# Patient Record
Sex: Female | Born: 1965 | Race: Black or African American | Hispanic: No | Marital: Single | Smoking: Current every day smoker
Health system: Southern US, Community
[De-identification: ages and names within clinical notes are randomized; demographics above are authoritative.]

## PROBLEM LIST (undated history)

## (undated) DIAGNOSIS — M1990 Unspecified osteoarthritis, unspecified site: Secondary | ICD-10-CM

## (undated) DIAGNOSIS — R079 Chest pain, unspecified: Secondary | ICD-10-CM

## (undated) DIAGNOSIS — E119 Type 2 diabetes mellitus without complications: Secondary | ICD-10-CM

## (undated) HISTORY — PX: KNEE SURGERY: SHX244

## (undated) HISTORY — PX: TUBAL LIGATION: SHX77

## (undated) HISTORY — DX: Chest pain, unspecified: R07.9

## (undated) HISTORY — DX: Unspecified osteoarthritis, unspecified site: M19.90

---

## 1989-08-24 HISTORY — PX: TUBAL LIGATION: SHX77

## 1998-07-15 ENCOUNTER — Emergency Department (HOSPITAL_COMMUNITY): Admission: EM | Admit: 1998-07-15 | Discharge: 1998-07-15 | Payer: Self-pay | Admitting: Emergency Medicine

## 1998-08-06 ENCOUNTER — Ambulatory Visit (HOSPITAL_COMMUNITY): Admission: RE | Admit: 1998-08-06 | Discharge: 1998-08-06 | Payer: Self-pay | Admitting: Family Medicine

## 1998-08-06 ENCOUNTER — Emergency Department (HOSPITAL_COMMUNITY): Admission: EM | Admit: 1998-08-06 | Discharge: 1998-08-06 | Payer: Self-pay | Admitting: Emergency Medicine

## 1998-08-06 ENCOUNTER — Encounter: Payer: Self-pay | Admitting: Family Medicine

## 1999-11-22 ENCOUNTER — Emergency Department (HOSPITAL_COMMUNITY): Admission: EM | Admit: 1999-11-22 | Discharge: 1999-11-22 | Payer: Self-pay | Admitting: Emergency Medicine

## 2001-01-17 ENCOUNTER — Encounter: Payer: Self-pay | Admitting: Emergency Medicine

## 2001-01-17 ENCOUNTER — Emergency Department (HOSPITAL_COMMUNITY): Admission: EM | Admit: 2001-01-17 | Discharge: 2001-01-17 | Payer: Self-pay | Admitting: Emergency Medicine

## 2001-12-19 ENCOUNTER — Emergency Department (HOSPITAL_COMMUNITY): Admission: EM | Admit: 2001-12-19 | Discharge: 2001-12-19 | Payer: Self-pay | Admitting: Emergency Medicine

## 2003-05-14 ENCOUNTER — Emergency Department (HOSPITAL_COMMUNITY): Admission: EM | Admit: 2003-05-14 | Discharge: 2003-05-14 | Payer: Self-pay | Admitting: Emergency Medicine

## 2003-05-17 ENCOUNTER — Encounter: Payer: Self-pay | Admitting: Emergency Medicine

## 2003-05-17 ENCOUNTER — Emergency Department (HOSPITAL_COMMUNITY): Admission: EM | Admit: 2003-05-17 | Discharge: 2003-05-17 | Payer: Self-pay | Admitting: Emergency Medicine

## 2004-03-29 ENCOUNTER — Emergency Department (HOSPITAL_COMMUNITY): Admission: EM | Admit: 2004-03-29 | Discharge: 2004-03-29 | Payer: Self-pay | Admitting: Emergency Medicine

## 2004-12-28 ENCOUNTER — Emergency Department (HOSPITAL_COMMUNITY): Admission: EM | Admit: 2004-12-28 | Discharge: 2004-12-28 | Payer: Self-pay | Admitting: Emergency Medicine

## 2004-12-30 ENCOUNTER — Emergency Department (HOSPITAL_COMMUNITY): Admission: EM | Admit: 2004-12-30 | Discharge: 2004-12-30 | Payer: Self-pay | Admitting: Emergency Medicine

## 2006-04-03 ENCOUNTER — Emergency Department (HOSPITAL_COMMUNITY): Admission: EM | Admit: 2006-04-03 | Discharge: 2006-04-03 | Payer: Self-pay | Admitting: Family Medicine

## 2007-05-10 ENCOUNTER — Emergency Department (HOSPITAL_COMMUNITY): Admission: EM | Admit: 2007-05-10 | Discharge: 2007-05-10 | Payer: Self-pay | Admitting: Emergency Medicine

## 2009-09-25 ENCOUNTER — Emergency Department (HOSPITAL_COMMUNITY): Admission: EM | Admit: 2009-09-25 | Discharge: 2009-09-25 | Payer: Self-pay | Admitting: Emergency Medicine

## 2010-05-14 ENCOUNTER — Emergency Department (HOSPITAL_COMMUNITY): Admission: EM | Admit: 2010-05-14 | Discharge: 2010-05-14 | Payer: Self-pay | Admitting: Emergency Medicine

## 2010-05-14 ENCOUNTER — Emergency Department (HOSPITAL_COMMUNITY): Admission: EM | Admit: 2010-05-14 | Discharge: 2010-05-14 | Payer: Self-pay | Admitting: Family Medicine

## 2010-11-06 LAB — URINALYSIS, ROUTINE W REFLEX MICROSCOPIC
Glucose, UA: NEGATIVE mg/dL
Ketones, ur: 15 mg/dL — AB
Nitrite: NEGATIVE
pH: 6.5 (ref 5.0–8.0)

## 2010-11-06 LAB — CBC
Hemoglobin: 14.1 g/dL (ref 12.0–15.0)
MCH: 33.5 pg (ref 26.0–34.0)
MCHC: 33.9 g/dL (ref 30.0–36.0)
Platelets: 190 10*3/uL (ref 150–400)
RBC: 4.21 MIL/uL (ref 3.87–5.11)

## 2010-11-06 LAB — POCT I-STAT, CHEM 8
BUN: 11 mg/dL (ref 6–23)
Chloride: 105 mEq/L (ref 96–112)
HCT: 43 % (ref 36.0–46.0)
Potassium: 4 mEq/L (ref 3.5–5.1)
Sodium: 138 mEq/L (ref 135–145)

## 2010-11-06 LAB — URINE MICROSCOPIC-ADD ON

## 2010-11-06 LAB — URINE CULTURE: Colony Count: >=100000

## 2010-11-06 LAB — POCT PREGNANCY, URINE: Preg Test, Ur: NEGATIVE

## 2011-03-18 ENCOUNTER — Emergency Department (HOSPITAL_COMMUNITY): Payer: Self-pay

## 2011-03-18 ENCOUNTER — Emergency Department (HOSPITAL_COMMUNITY)
Admission: EM | Admit: 2011-03-18 | Discharge: 2011-03-18 | Disposition: A | Discharge status: Home / Self Care | Payer: Self-pay | Attending: Emergency Medicine | Admitting: Emergency Medicine

## 2011-03-18 DIAGNOSIS — R55 Syncope and collapse: Secondary | ICD-10-CM | POA: Insufficient documentation

## 2011-03-18 DIAGNOSIS — R51 Headache: Secondary | ICD-10-CM | POA: Insufficient documentation

## 2011-03-18 DIAGNOSIS — R5383 Other fatigue: Secondary | ICD-10-CM | POA: Insufficient documentation

## 2011-03-18 DIAGNOSIS — R42 Dizziness and giddiness: Secondary | ICD-10-CM | POA: Insufficient documentation

## 2011-03-18 DIAGNOSIS — R5381 Other malaise: Secondary | ICD-10-CM | POA: Insufficient documentation

## 2011-03-18 LAB — URINALYSIS, ROUTINE W REFLEX MICROSCOPIC
Ketones, ur: NEGATIVE mg/dL
Nitrite: NEGATIVE
Protein, ur: NEGATIVE mg/dL
Urobilinogen, UA: 1 mg/dL (ref 0.0–1.0)

## 2011-03-18 LAB — CBC
HCT: 41 % (ref 36.0–46.0)
Hemoglobin: 14.2 g/dL (ref 12.0–15.0)
MCH: 33.6 pg (ref 26.0–34.0)
MCHC: 34.6 g/dL (ref 30.0–36.0)
MCV: 96.9 fL (ref 78.0–100.0)

## 2011-03-18 LAB — DIFFERENTIAL
Basophils Absolute: 0 10*3/uL (ref 0.0–0.1)
Eosinophils Relative: 1 % (ref 0–5)
Lymphocytes Relative: 30 % (ref 12–46)
Lymphs Abs: 2.3 10*3/uL (ref 0.7–4.0)
Monocytes Absolute: 0.8 10*3/uL (ref 0.1–1.0)
Monocytes Relative: 10 % (ref 3–12)
Neutro Abs: 4.6 10*3/uL (ref 1.7–7.7)

## 2011-03-18 LAB — POCT I-STAT, CHEM 8
Calcium, Ion: 1.17 mmol/L (ref 1.12–1.32)
Chloride: 102 mEq/L (ref 96–112)
Glucose, Bld: 92 mg/dL (ref 70–99)
HCT: 44 % (ref 36.0–46.0)
Hemoglobin: 15 g/dL (ref 12.0–15.0)

## 2011-03-18 LAB — POCT PREGNANCY, URINE: Preg Test, Ur: NEGATIVE

## 2011-06-04 LAB — I-STAT 8, (EC8 V) (CONVERTED LAB)
BUN: 9
Bicarbonate: 23.3
Glucose, Bld: 92
HCT: 46
Operator id: 285491
TCO2: 24
pCO2, Ven: 37.5 — ABNORMAL LOW

## 2011-06-04 LAB — D-DIMER, QUANTITATIVE: D-Dimer, Quant: 0.29

## 2011-06-04 LAB — POCT CARDIAC MARKERS
CKMB, poc: <1 — ABNORMAL LOW
Troponin i, poc: <0.05

## 2011-06-04 LAB — POCT I-STAT CREATININE: Operator id: 285491

## 2011-12-09 ENCOUNTER — Encounter (HOSPITAL_COMMUNITY): Payer: Self-pay | Admitting: *Deleted

## 2011-12-09 ENCOUNTER — Emergency Department (HOSPITAL_COMMUNITY)
Admission: EM | Admit: 2011-12-09 | Discharge: 2011-12-09 | Disposition: A | Discharge status: Home / Self Care | Payer: Self-pay | Attending: Emergency Medicine | Admitting: Emergency Medicine

## 2011-12-09 DIAGNOSIS — L03317 Cellulitis of buttock: Secondary | ICD-10-CM | POA: Insufficient documentation

## 2011-12-09 DIAGNOSIS — L0231 Cutaneous abscess of buttock: Secondary | ICD-10-CM | POA: Insufficient documentation

## 2011-12-09 MED ORDER — CEPHALEXIN 500 MG PO CAPS: 500.0000 mg | ORAL_CAPSULE | Freq: Four times a day (QID) | ORAL | Status: AC

## 2011-12-09 MED ORDER — HYDROCODONE-ACETAMINOPHEN 5-500 MG PO TABS: 1.0000 | ORAL_TABLET | Freq: Four times a day (QID) | ORAL | Status: AC | PRN

## 2011-12-09 MED ORDER — SULFAMETHOXAZOLE-TRIMETHOPRIM 800-160 MG PO TABS: 1.0000 | ORAL_TABLET | Freq: Two times a day (BID) | ORAL | Status: AC

## 2011-12-09 MED ORDER — LIDOCAINE-EPINEPHRINE (PF) 2 %-1:200000 IJ SOLN
10.0000 mL | Freq: Once | INTRAMUSCULAR | Status: AC
  Administered 2011-12-09: 10 mL

## 2011-12-09 NOTE — Discharge Instructions (Signed)
Continue keeping that area clean, warm compresses several times a day. Try to keep packing in. Take both keflex and bactrim for the infection, take vicodin as prescribed as needed for severe pain, do not drive while taking.   Abscess An abscess (boil or furuncle) is an infected area that contains a collection of pus.  SYMPTOMS Signs and symptoms of an abscess include pain, tenderness, redness, or hardness. You may feel a moveable soft area under your skin. An abscess can occur anywhere in the body.  TREATMENT  A surgical cut (incision) may be made over your abscess to drain the pus. Gauze may be packed into the space or a drain may be looped through the abscess cavity (pocket). This provides a drain that will allow the cavity to heal from the inside outwards. The abscess may be painful for a few days, but should feel much better if it was drained.  Your abscess, if seen early, may not have localized and may not have been drained. If not, another appointment may be required if it does not get better on its own or with medications. HOME CARE INSTRUCTIONS   Only take over-the-counter or prescription medicines for pain, discomfort, or fever as directed by your caregiver.   Take your antibiotics as directed if they were prescribed. Finish them even if you start to feel better.   Keep the skin and clothes clean around your abscess.   If the abscess was drained, you will need to use gauze dressing to collect any draining pus. Dressings will typically need to be changed 3 or more times a day.   The infection may spread by skin contact with others. Avoid skin contact as much as possible.   Practice good hygiene. This includes regular hand washing, cover any draining skin lesions, and do not share personal care items.   If you participate in sports, do not share athletic equipment, towels, whirlpools, or personal care items. Shower after every practice or tournament.   If a draining area cannot be  adequately covered:   Do not participate in sports.   Children should not participate in day care until the wound has healed or drainage stops.   If your caregiver has given you a follow-up appointment, it is very important to keep that appointment. Not keeping the appointment could result in a much worse infection, chronic or permanent injury, pain, and disability. If there is any problem keeping the appointment, you must call back to this facility for assistance.  SEEK MEDICAL CARE IF:   You develop increased pain, swelling, redness, drainage, or bleeding in the wound site.   You develop signs of generalized infection including muscle aches, chills, fever, or a general ill feeling.   You have an oral temperature above 102 F (38.9 C).  MAKE SURE YOU:   Understand these instructions.   Will watch your condition.   Will get help right away if you are not doing well or get worse.  Document Released: 05/20/2005 Document Revised: 07/30/2011 Document Reviewed: 03/13/2008 [facility] Patient Information 2012 [place], [place].

## 2011-12-09 NOTE — ED Provider Notes (Signed)
History     CSN: [id number]  Arrival date & time 12/09/11  1127   First MD Initiated Contact with Patient 12/09/11 1129      No chief complaint on file.   (Consider location/radiation/quality/duration/timing/severity/associated sxs/prior treatment) Patient is a 46 y.o. female presenting with abscess. The history is provided by the patient.  Abscess  This is a new problem. The current episode started less than one week ago. The problem has been gradually worsening. The abscess is present on the right buttock. The problem is moderate. The abscess is characterized by draining, swelling, painfulness and redness. The patient was exposed to OTC medications. Pertinent negatives include no fever.  PT reports abscess that has been worsening over last week. States she has been soaking it, applying warm compresses, with no relief. States hx of the same, but usually resolve in their own. No fever, chills, malaise.   No past medical history on file.  No past surgical history on file.  No family history on file.  History  Substance Use Topics  . Smoking status: Not on file  . Smokeless tobacco: Not on file  . Alcohol Use: Not on file    OB History    No data available      Review of Systems  Constitutional: Negative for fever and chills.  Respiratory: Negative.   Cardiovascular: Negative.   Gastrointestinal: Negative.   Genitourinary: Negative.   Musculoskeletal: Negative.   Skin:       Positive for abscess  Neurological: Negative.   Psychiatric/Behavioral: Negative.     Allergies  Review of patient's allergies indicates no known allergies.  Home Medications   Current Outpatient Rx  Name Route Sig Dispense Refill  . IBUPROFEN 200 MG PO TABS Oral Take 200 mg by mouth every 6 (six) hours as needed. For pain      There were no vitals taken for this visit.  Physical Exam  Constitutional: She is oriented to person, place, and time. She appears well-developed and  well-nourished. No distress.  HENT:  Head: Normocephalic.  Eyes: Conjunctivae are normal.  Neck: Neck supple.  Cardiovascular: Normal rate, regular rhythm and normal heart sounds.   Pulmonary/Chest: Effort normal and breath sounds normal. No respiratory distress.  Neurological: She is alert and oriented to person, place, and time.  Skin: Skin is warm and dry.       Large, about 5cm abscess to the right mid buttock. Erythemous, indurated, tender to palpation. No drainage  Psychiatric: She has a normal mood and affect.    ED Course  Procedures (including critical care time)  INCISION AND DRAINAGE Performed by: Jaynie Crumble A Consent: Verbal consent obtained. Risks and benefits: risks, benefits and alternatives were discussed Type: abscess  Body area: Right buttocks  Anesthesia: local infiltration  Local anesthetic: lidocaine 2% w/ epinephrine  Anesthetic total: 5 ml  Complexity: complex Blunt dissection to break up loculations  Drainage: purulent  Drainage amount:large  Packing material: 1/4 in iodoform gauze  Patient tolerance: Patient tolerated the procedure well with no immediate complications.  12:40 PM Abscess drained, packed. Pt tolerated it well. She is otherwise non toxic. Will start on antibiotics and close follow up.    1. Abscess of buttock, right       MDM          Lottie Mussel, PA 12/09/11 1554

## 2011-12-09 NOTE — ED Notes (Signed)
Pt reports an abscess in her butt crack area since Saturday-started to have mild drainage today.

## 2011-12-10 NOTE — ED Provider Notes (Signed)
Medical screening examination/treatment/procedure(s) were performed by non-physician practitioner and as supervising physician I was immediately available for consultation/collaboration.  Yanitza Shvartsman, MD 12/10/11 0729 

## 2011-12-11 ENCOUNTER — Encounter (HOSPITAL_COMMUNITY): Payer: Self-pay | Admitting: Emergency Medicine

## 2011-12-11 ENCOUNTER — Emergency Department (HOSPITAL_COMMUNITY)
Admission: EM | Admit: 2011-12-11 | Discharge: 2011-12-11 | Disposition: A | Discharge status: Home / Self Care | Payer: Self-pay | Attending: Emergency Medicine | Admitting: Emergency Medicine

## 2011-12-11 DIAGNOSIS — F17200 Nicotine dependence, unspecified, uncomplicated: Secondary | ICD-10-CM | POA: Insufficient documentation

## 2011-12-11 DIAGNOSIS — L02219 Cutaneous abscess of trunk, unspecified: Secondary | ICD-10-CM | POA: Insufficient documentation

## 2011-12-11 DIAGNOSIS — L0291 Cutaneous abscess, unspecified: Secondary | ICD-10-CM

## 2011-12-11 DIAGNOSIS — Z4801 Encounter for change or removal of surgical wound dressing: Secondary | ICD-10-CM | POA: Insufficient documentation

## 2011-12-11 NOTE — ED Provider Notes (Signed)
History     CSN: [id number]  Arrival date & time 12/11/11  4098   First MD Initiated Contact with Patient 12/11/11 [phone]      Chief Complaint  Patient presents with  . Abscess    wound check    (Consider location/radiation/quality/duration/timing/severity/associated sxs/prior treatment) HPI Comments: 46 yo female returns to the ED this morning for a wound check.  An I&D of an abscess on the right side of the gluteal cleft was performed on 12/09/2011 and the wound was packed until this morning when the packing fell out. Patient was started on antibiotics at that time. Abscess has not been painful, but it continues to ooze pus. Denies fever, malaise.   Patient is a 46 y.o. female presenting with abscess. The history is provided by the patient.  Abscess  This is a new problem. The current episode started more than one week ago. The onset was gradual. The problem has been rapidly improving. Pertinent negatives include no fever and no vomiting.    History reviewed. No pertinent past medical history.  Past Surgical History  Procedure Date  . Tubal ligation     History reviewed. No pertinent family history.  History  Substance Use Topics  . Smoking status: Current Everyday Smoker    Types: Cigarettes  . Smokeless tobacco: Not on file  . Alcohol Use: No    OB History    Grav Para Term Preterm Abortions TAB SAB Ect Mult Living                  Review of Systems  Constitutional: Negative for fever.  Gastrointestinal: Negative for nausea, vomiting and rectal pain.  Skin: Negative for color change.       Positive for abscess.  Hematological: Negative for adenopathy.    Allergies  Review of patient's allergies indicates no known allergies.  Home Medications   Current Outpatient Rx  Name Route Sig Dispense Refill  . CEPHALEXIN 500 MG PO CAPS Oral Take 1 capsule (500 mg total) by mouth 4 (four) times daily. 28 capsule 0  . IBUPROFEN 200 MG PO TABS Oral Take 200 mg by  mouth every 6 (six) hours as needed. For pain    . SULFAMETHOXAZOLE-TRIMETHOPRIM 800-160 MG PO TABS Oral Take 1 tablet by mouth every 12 (twelve) hours. 14 tablet 0  . HYDROCODONE-ACETAMINOPHEN 5-500 MG PO TABS Oral Take 1-2 tablets by mouth every 6 (six) hours as needed for pain. 10 tablet 0    BP 109/68  Pulse 86  Temp(Src) 98.4 F (36.9 C) (Oral)  SpO2 100%  LMP 11/02/2011  Physical Exam  Nursing note and vitals reviewed. Constitutional: She is oriented to person, place, and time. She appears well-developed and well-nourished. No distress.  HENT:  Head: Normocephalic and atraumatic.  Eyes: Conjunctivae are normal.  Neck: Normal range of motion. Neck supple.  Pulmonary/Chest: No respiratory distress.  Neurological: She is alert and oriented to person, place, and time.  Skin: Skin is warm and dry.       Open abscess with incision approximately 1cm in diameter, slightly gaping, with mild surrounding erythema. There is small amount of purulence noted but no active drainage. No signs of cellulitis in surrounding skin.   Psychiatric: She has a normal mood and affect.    ED Course  Procedures (including critical care time)  Labs Reviewed - No data to display No results found.   1. Abscess     10:39 AM Patient seen and examined.  Vital signs  reviewed and are as follows: Filed Vitals:   12/11/11 0939  BP: 109/68  Pulse: 86  Temp: 98.4 F (36.9 C)   Wound is healing well. It does not need repacked. Patient counseled to continue warm soaks.   The patient was urged to return to the Emergency Department urgently with worsening pain, swelling, expanding erythema especially if it streaks away from the affected area, fever, or if they have any other concerns. Patient verbalized understanding.     MDM  Abscess, healing well. Pt to continue oral abx, return with worsening.         Renne Crigler, [place] 12/11/11 [phone]

## 2011-12-11 NOTE — ED Provider Notes (Signed)
Medical screening examination/treatment/procedure(s) were performed by non-physician practitioner and as supervising physician I was immediately available for consultation/collaboration.    Luvina Poirier R Brittanyann Wittner, MD 12/11/11 1626 

## 2011-12-11 NOTE — Discharge Instructions (Signed)
Please read and follow all provided instructions.  Your diagnoses today include:  1. Abscess     Tests performed today include:  Vital signs. See below for your results today.   Medications prescribed: None today  Home care instructions:   Follow any educational materials contained in this packet  Take full course of previously prescribed antibiotics  Follow-up instructions: Please follow-up with your primary care provider in the next 1 week for further evaluation of your symptoms. If you do not have a primary care doctor -- see below for referral information.   Return instructions:  Return to the Emergency Department if you have:  Fever  Worsening symptoms  Worsening pain  Worsening swelling  Redness of the skin that moves away from the affected area, especially if it streaks away from the affected area   Any other emergent concerns  Additional Information: If you have recurrent abscesses, try both the following. Use a Qtip to apply an over-the-counter antibiotic to the inside of your nostrils, twice a day for 5 days. Wash your body with over-the-counter Hibaclens once a day for one week and then once every two weeks. This can reduce the amount of bacterial on your skin that causes boils and lead to fewer boils. If you continue to have multiple or recurrent boils, you should see a dermatologist (skin doctor).   Your vital signs today were: BP 109/68  Pulse 86  Temp(Src) 98.4 F (36.9 C) (Oral)  SpO2 100%  LMP 11/02/2011 If your blood pressure (BP) was elevated above 135/85 this visit, please have this repeated by your doctor within one month. -------------- No Primary Care Doctor Call [facility]  [phone] Other agencies that provide inexpensive medical care    [facility]  [phone]    [facility]  [phone]    [facility]  [phone]    [facility]  [phone]    [facility]  [phone]    [facility]   [phone] -------------- RESOURCE GUIDE:  Dental Problems  Patients with Medicaid: [facility] [id number]                                            [phone] [address] Phone:  [phone]                                                   Phone:  [phone]  If unable to pay or uninsured, contact:  [facility] or [facility]. to become qualified for the adult dental clinic.  Chronic Pain Problems Contact [facility]  [phone] Patients need to be referred by their primary care doctor.  Insufficient Money for Medicine Contact [employer]:  call "211" or [facility] [phone].  Psychological Services [facility]  [phone] [facility]  [phone] [facility]   [phone] (emergency services [phone])  Substance Abuse Resources [facility]  [phone] [facility] [phone] [facility] [phone] Floydene Flock [phone] Residential & Outpatient Substance Abuse Program  [phone]  Abuse/Neglect [facility] Child Abuse Hotline [phone] [facility] Child Abuse Hotline 667-423-7581 (After Hours)  Emergency Shelter [facility] [phone]  M[facility] [phone] [facility] [phone]  [facility]  [facility]     [employer]                          [facility] Dept. [address] [place]                       [address]      [address]  Blondell Reveal Phone:  [phone]                                   Phone:  [phone]                 Phone:  [phone]  [facility] Phone:  [phone]  [facility] Child Abuse Hotline [phone] [phone] (After Hours)

## 2013-04-10 ENCOUNTER — Encounter (HOSPITAL_COMMUNITY): Payer: Self-pay | Admitting: Emergency Medicine

## 2013-04-10 ENCOUNTER — Emergency Department (HOSPITAL_COMMUNITY)
Admission: EM | Admit: 2013-04-10 | Discharge: 2013-04-10 | Disposition: A | Discharge status: Home / Self Care | Payer: Self-pay | Attending: Emergency Medicine | Admitting: Emergency Medicine

## 2013-04-10 DIAGNOSIS — M549 Dorsalgia, unspecified: Secondary | ICD-10-CM | POA: Insufficient documentation

## 2013-04-10 DIAGNOSIS — R350 Frequency of micturition: Secondary | ICD-10-CM | POA: Insufficient documentation

## 2013-04-10 DIAGNOSIS — F17200 Nicotine dependence, unspecified, uncomplicated: Secondary | ICD-10-CM | POA: Insufficient documentation

## 2013-04-10 LAB — URINALYSIS, ROUTINE W REFLEX MICROSCOPIC
Glucose, UA: NEGATIVE mg/dL
Specific Gravity, Urine: 1.03 (ref 1.005–1.030)

## 2013-04-10 LAB — URINE MICROSCOPIC-ADD ON

## 2013-04-10 MED ORDER — HYDROCODONE-ACETAMINOPHEN 5-325 MG PO TABS
2.0000 | ORAL_TABLET | Freq: Once | ORAL | Status: AC
  Administered 2013-04-10: 2 via ORAL
  Filled 2013-04-10: qty 2

## 2013-04-10 MED ORDER — HYDROCODONE-ACETAMINOPHEN 5-325 MG PO TABS: 2.0000 | ORAL_TABLET | Freq: Four times a day (QID) | ORAL | Status: DC | PRN

## 2013-04-10 MED ORDER — PROMETHAZINE HCL 25 MG PO TABS: 25.0000 mg | ORAL_TABLET | Freq: Four times a day (QID) | ORAL | Status: DC | PRN

## 2013-04-10 MED ORDER — DIAZEPAM 5 MG PO TABS: 5.0000 mg | ORAL_TABLET | Freq: Two times a day (BID) | ORAL | Status: DC

## 2013-04-10 MED ORDER — ONDANSETRON 8 MG PO TBDP
8.0000 mg | ORAL_TABLET | Freq: Once | ORAL | Status: AC
  Administered 2013-04-10: 8 mg via ORAL
  Filled 2013-04-10: qty 1

## 2013-04-10 MED ORDER — DIAZEPAM 5 MG PO TABS
5.0000 mg | ORAL_TABLET | Freq: Once | ORAL | Status: AC
  Administered 2013-04-10: 5 mg via ORAL
  Filled 2013-04-10: qty 1

## 2013-04-10 NOTE — ED Provider Notes (Signed)
CSN: [id number]     Arrival date & time 04/10/13  1337 History  This chart was scribed for non-physician practitioner, Junious Silk, PA-C working with Celene Kras, MD by Greggory Stallion, ED scribe. This patient was seen in room WTR9/WTR9 and the patient's care was started at 2:27 PM.   Chief Complaint  Patient presents with  . Back Pain   The history is provided by the patient. No language interpreter was used.    HPI Comments: Rachael Robinson is a 47 y.o. female who presents to the Emergency Department complaining of gradual onset, gradually worsening right lower and mid back pain that started 2 weeks ago. She states the pain started after she was trying to get out of the bathtub and pulled something in her back. Certain movements worsen the pain. Pt has tried ibuprofen and a heating pad with no relief. She states the ibuprofen used to work but it doesn't anymore. Pt states she has been using the bathroom more frequently. Pt denies fever, nausea, emesis, rash, dysuria, urinary urgency, bowel incontinence and urinary incontinence as associated symptoms.    History reviewed. No pertinent past medical history. Past Surgical History  Procedure Laterality Date  . Tubal ligation     No family history on file. History  Substance Use Topics  . Smoking status: Current Every Day Smoker    Types: Cigarettes  . Smokeless tobacco: Not on file  . Alcohol Use: No   OB History   Grav Para Term Preterm Abortions TAB SAB Ect Mult Living                 Review of Systems  Constitutional: Negative for fever.  Gastrointestinal: Negative for nausea and vomiting.  Genitourinary: Positive for frequency. Negative for dysuria and urgency.  Musculoskeletal: Positive for back pain.  Skin: Negative for rash.  All other systems reviewed and are negative.    Allergies  Review of patient's allergies indicates no known allergies.  Home Medications   Current Outpatient Rx  Name  Route  Sig   Dispense  Refill  . ibuprofen (ADVIL,MOTRIN) 200 MG tablet   Oral   Take 200 mg by mouth every 6 (six) hours as needed. For pain          BP 149/82  Pulse 76  Temp(Src) 98.6 F (37 C) (Oral)  Resp 18  SpO2 100%  Physical Exam  Nursing note and vitals reviewed. Constitutional: She is oriented to person, place, and time. She appears well-developed and well-nourished. No distress.  HENT:  Head: Normocephalic and atraumatic.  Right Ear: External ear normal.  Left Ear: External ear normal.  Nose: Nose normal.  Mouth/Throat: Oropharynx is clear and moist.  Eyes: Conjunctivae are normal.  Neck: Normal range of motion.  Cardiovascular: Normal rate, regular rhythm and normal heart sounds.   Pulmonary/Chest: Effort normal and breath sounds normal. No stridor. No respiratory distress. She has no wheezes. She has no rales.  Abdominal: Soft. She exhibits no distension.  Musculoskeletal: Normal range of motion.  Tender to palpation over right thoracic back. No bony tenderness. Ambulatory without antalgic gait. Strength normal.  Neurological: She is alert and oriented to person, place, and time. She has normal strength.  Skin: Skin is warm and dry. She is not diaphoretic. No erythema.  Psychiatric: She has a normal mood and affect. Her behavior is normal.    ED Course   Procedures (including critical care time)  DIAGNOSTIC STUDIES: Oxygen Saturation is 100% on RA,  normal by my interpretation.    COORDINATION OF CARE: 3:07 PM-Discussed treatment plan which includes UA, a muscle relaxer, and pain medication with pt at bedside and pt agreed to plan.   Labs Reviewed  URINALYSIS, ROUTINE W REFLEX MICROSCOPIC - Abnormal; Notable for the following:    APPearance CLOUDY (*)    Hgb urine dipstick MODERATE (*)    All other components within normal limits  URINE MICROSCOPIC-ADD ON - Abnormal; Notable for the following:    Squamous Epithelial / LPF MANY (*)    All other components within  normal limits   No results found. 1. Back pain     MDM  Patient with back pain.  No neurological deficits and normal neuro exam.  Patient can walk but states is painful.  Due to thoracic location of back pain and urinary frequency a UA was done. No UTI. No loss of bowel or bladder control.  No concern for cauda equina.  No fever, night sweats, weight loss, h/o cancer, IVDU.  RICE protocol and pain medicine indicated and discussed with patient.        I personally performed the services described in this documentation, which was scribed in my presence. The recorded information has been reviewed and is accurate.    Mora Bellman, PA-C 04/10/13 2104

## 2013-04-10 NOTE — ED Notes (Addendum)
Pt c/o roght lower and mid back pain x 5 days. States that yesterday it was worse. Movement makes it worse. State she pulled back getting out of tub 2 weeks ago.

## 2013-04-13 NOTE — ED Provider Notes (Signed)
Medical screening examination/treatment/procedure(s) were performed by non-physician practitioner and as supervising physician I was immediately available for consultation/collaboration.   Sueann Brownley L Zahmir Lalla, MD 04/13/13 1204 

## 2013-05-24 ENCOUNTER — Emergency Department (HOSPITAL_COMMUNITY)
Admission: EM | Admit: 2013-05-24 | Discharge: 2013-05-24 | Disposition: A | Discharge status: Home / Self Care | Payer: BC Managed Care – PPO | Attending: Emergency Medicine | Admitting: Emergency Medicine

## 2013-05-24 ENCOUNTER — Emergency Department (HOSPITAL_COMMUNITY): Payer: BC Managed Care – PPO

## 2013-05-24 DIAGNOSIS — R059 Cough, unspecified: Secondary | ICD-10-CM | POA: Insufficient documentation

## 2013-05-24 DIAGNOSIS — R062 Wheezing: Secondary | ICD-10-CM | POA: Insufficient documentation

## 2013-05-24 DIAGNOSIS — R05 Cough: Secondary | ICD-10-CM | POA: Insufficient documentation

## 2013-05-24 DIAGNOSIS — R0602 Shortness of breath: Secondary | ICD-10-CM | POA: Insufficient documentation

## 2013-05-24 DIAGNOSIS — J029 Acute pharyngitis, unspecified: Secondary | ICD-10-CM | POA: Insufficient documentation

## 2013-05-24 DIAGNOSIS — R6883 Chills (without fever): Secondary | ICD-10-CM | POA: Insufficient documentation

## 2013-05-24 DIAGNOSIS — F17200 Nicotine dependence, unspecified, uncomplicated: Secondary | ICD-10-CM | POA: Insufficient documentation

## 2013-05-24 MED ORDER — HYDROCODONE-HOMATROPINE 5-1.5 MG/5ML PO SYRP: 5.0000 mL | ORAL_SOLUTION | Freq: Four times a day (QID) | ORAL | Status: DC | PRN

## 2013-05-24 NOTE — ED Provider Notes (Signed)
CSN: [id number]     Arrival date & time 05/24/13  2057 History  This chart was scribed for Antony Madura, PA, working with Celene Kras, MD by Blanchard Kelch, ED Scribe. This patient was seen in room WTR7/WTR7 and the patient's care was started at 9:33 PM.    Chief Complaint  Patient presents with  . Cough    Patient is a 47 y.o. female presenting with cough. The history is provided by the patient. No language interpreter was used.  Cough Cough characteristics:  Non-productive and dry Duration:  4 days Timing:  Constant Progression:  Worsening Smoker: yes   Context: sick contacts   Relieved by:  None tried Ineffective treatments:  None tried Associated symptoms: chills, shortness of breath, sore throat and wheezing   Associated symptoms: no chest pain, no fever, no rhinorrhea and no sinus congestion     HPI Comments: Rachael Robinson is a 47 y.o. female who presents to the Emergency Department complaining of a constant, worsening, non-productive dry cough that began four days ago. She complains of associated intermittent shortness of breath, chest tightness, hoarse throat/voice, chills and intermittent wheezing. She denies taking anything for the symptoms. She reports sick contacts. She denies syncope, chest pain, nausea, vomiting, fever, drooling, sinus congestion, nasal congestion, or rhinnorhea. She denies a past medical history of asthma. She claims to smoke about a pack every two days but hasn't been smoking as much since getting sick.   No past medical history on file. Past Surgical History  Procedure Laterality Date  . Tubal ligation     No family history on file. History  Substance Use Topics  . Smoking status: Current Every Day Smoker    Types: Cigarettes  . Smokeless tobacco: Not on file  . Alcohol Use: No   OB History   Grav Para Term Preterm Abortions TAB SAB Ect Mult Living                 Review of Systems  Constitutional: Positive for chills. Negative for  fever.  HENT: Positive for sore throat. Negative for congestion and rhinorrhea.   Respiratory: Positive for cough, shortness of breath and wheezing.   Cardiovascular: Negative for chest pain.  Gastrointestinal: Negative for nausea and vomiting.  Neurological: Negative for syncope.  All other systems reviewed and are negative.    Allergies  Review of patient's allergies indicates no known allergies.  Home Medications   Current Outpatient Rx  Name  Route  Sig  Dispense  Refill  . pseudoephedrine (SUDAFED) 30 MG tablet   Oral   Take 30 mg by mouth every 4 (four) hours as needed for congestion.         Marland Kitchen HYDROcodone-homatropine (HYCODAN) 5-1.5 MG/5ML syrup   Oral   Take 5 mLs by mouth every 6 (six) hours as needed for cough.   120 mL   0    Triage Vitals: BP 154/87  Pulse 76  Temp(Src) 98.9 F (37.2 C) (Oral)  Resp 20  SpO2 99%  LMP 05/02/2013  Physical Exam  Nursing note and vitals reviewed. Constitutional: She is oriented to person, place, and time. She appears well-developed and well-nourished. No distress.  HENT:  Head: Normocephalic and atraumatic.  Mouth/Throat: Oropharynx is clear and moist. No oropharyngeal exudate.  Airway patent and patient tolerating secretions without difficulty  Eyes: Conjunctivae and EOM are normal. Pupils are equal, round, and reactive to light. No scleral icterus.  Neck: Normal range of motion. Neck supple.  Cardiovascular: Normal rate, regular rhythm and normal heart sounds.   Pulmonary/Chest: Effort normal and breath sounds normal. No stridor. No respiratory distress. She has no wheezes. She has no rales. She exhibits no tenderness.  Abdominal: Soft. She exhibits no distension. There is no tenderness.  Musculoskeletal: Normal range of motion.  Lymphadenopathy:    She has no cervical adenopathy.  Neurological: She is alert and oriented to person, place, and time.  Skin: Skin is warm and dry. No rash noted. She is not diaphoretic. No  erythema. No pallor.  Psychiatric: She has a normal mood and affect. Her behavior is normal.    ED Course  Procedures (including critical care time)  DIAGNOSTIC STUDIES: Oxygen Saturation is 99% on room air, normal by my interpretation.    COORDINATION OF CARE: 9:39 PM -Will order chest x-ray. Patient verbalizes understanding and agrees with treatment plan.   Labs Review Labs Reviewed - No data to display Imaging Review Dg Chest 2 View  05/24/2013   CLINICAL DATA:  Chest tightness and shortness of breath  EXAM: CHEST  2 VIEW  COMPARISON:  March 18, 2011  FINDINGS: Lungs are clear. Heart size and pulmonary vascularity are normal. No adenopathy. No pneumothorax. There is mild degenerative change in the thoracic spine.  IMPRESSION: No edema or consolidation.   Electronically Signed   By: Bretta Bang   On: 05/24/2013 21:55    MDM   1. Cough    47 year old female with no significant past medical history presents for a dry, nonproductive cough. Patient as well and nontoxic appearing, hemodynamically stable, and afebrile. Patient is without tachycardia, tachypnea, dyspnea, or hypoxia. Lung sounds clear to auscultation bilaterally and there are no retractions or accessory muscle use. Chest x-ray negative for bronchitis and pneumonia; no other acute cardiopulmonary changes identified. 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 She is stable and appropriate for discharge with primary care followup as needed. Over-the-counter cough suppressants recommended for vent and Hycodan prescribed for persistent cough. Return precautions discussed and patient agreeable to plan with no unaddressed concerns.  I personally performed the services described in this documentation, which was scribed in my presence. The recorded information has been reviewed and is accurate.     Antony Madura, PA-C 05/24/13 2217

## 2013-05-24 NOTE — ED Provider Notes (Signed)
Medical screening examination/treatment/procedure(s) were performed by non-physician practitioner and as supervising physician I was immediately available for consultation/collaboration.     Celene Kras, MD 05/24/13 [phone]

## 2013-05-24 NOTE — ED Notes (Addendum)
Pt has had a dry cough x several days. States now she is losing her voice and feels like she 'can't get air.' 99% RA.

## 2013-11-29 ENCOUNTER — Ambulatory Visit: Payer: Self-pay | Admitting: Emergency Medicine

## 2013-12-06 ENCOUNTER — Ambulatory Visit: Payer: Self-pay | Admitting: Emergency Medicine

## 2013-12-06 ENCOUNTER — Encounter: Payer: Self-pay | Admitting: Emergency Medicine

## 2013-12-29 ENCOUNTER — Encounter (HOSPITAL_COMMUNITY): Payer: Self-pay | Admitting: Emergency Medicine

## 2013-12-29 ENCOUNTER — Emergency Department (HOSPITAL_COMMUNITY)
Admission: EM | Admit: 2013-12-29 | Discharge: 2013-12-29 | Disposition: A | Discharge status: Home / Self Care | Payer: BC Managed Care – PPO | Attending: Emergency Medicine | Admitting: Emergency Medicine

## 2013-12-29 DIAGNOSIS — M5430 Sciatica, unspecified side: Secondary | ICD-10-CM

## 2013-12-29 DIAGNOSIS — IMO0002 Reserved for concepts with insufficient information to code with codable children: Secondary | ICD-10-CM | POA: Insufficient documentation

## 2013-12-29 DIAGNOSIS — M542 Cervicalgia: Secondary | ICD-10-CM | POA: Insufficient documentation

## 2013-12-29 DIAGNOSIS — Y929 Unspecified place or not applicable: Secondary | ICD-10-CM | POA: Insufficient documentation

## 2013-12-29 DIAGNOSIS — X58XXXA Exposure to other specified factors, initial encounter: Secondary | ICD-10-CM | POA: Insufficient documentation

## 2013-12-29 DIAGNOSIS — S39012A Strain of muscle, fascia and tendon of lower back, initial encounter: Secondary | ICD-10-CM

## 2013-12-29 DIAGNOSIS — Y939 Activity, unspecified: Secondary | ICD-10-CM | POA: Insufficient documentation

## 2013-12-29 DIAGNOSIS — F17200 Nicotine dependence, unspecified, uncomplicated: Secondary | ICD-10-CM | POA: Insufficient documentation

## 2013-12-29 MED ORDER — IBUPROFEN 800 MG PO TABS: 800.0000 mg | ORAL_TABLET | Freq: Three times a day (TID) | ORAL | Status: DC

## 2013-12-29 MED ORDER — HYDROCODONE-ACETAMINOPHEN 5-325 MG PO TABS: 1.0000 | ORAL_TABLET | ORAL | Status: DC | PRN

## 2013-12-29 MED ORDER — CYCLOBENZAPRINE HCL 10 MG PO TABS: 10.0000 mg | ORAL_TABLET | Freq: Every day | ORAL | Status: DC

## 2013-12-29 MED ORDER — KETOROLAC TROMETHAMINE 30 MG/ML IJ SOLN
30.0000 mg | Freq: Once | INTRAMUSCULAR | Status: AC
  Administered 2013-12-29: 30 mg via INTRAMUSCULAR
  Filled 2013-12-29: qty 1

## 2013-12-29 NOTE — ED Provider Notes (Signed)
CSN: [id number]     Arrival date & time 12/29/13  0709 History   First MD Initiated Contact with Patient 12/29/13 [phone]     Chief Complaint  Patient presents with  . Back Pain     (Consider location/radiation/quality/duration/timing/severity/associated sxs/prior Treatment) HPI Comments: The patient is a 48 year old presents to the emergency room chief complaint of right mid back pain for a week and a half.  Describes discomfort as pain, spasm. She reports worsening symptoms since last night. Increased discomfort with movement. She also complains of right lower extremity pain since this morning, similar to previous history of sciatica. She reports taking ibuprofen last night without full resolution of symptoms. 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 No PCP  Patient is a 48 y.o. female presenting with back pain. The history is provided by the patient and medical records. No language interpreter was used.  Back Pain Associated symptoms: no fever and no numbness     History reviewed. No pertinent past medical history. Past Surgical History  Procedure Laterality Date  . Tubal ligation     Family History  Problem Relation Age of Onset  . Heart disease Mother   . Diabetes Mother   . Hyperlipidemia Mother   . Kidney disease Mother   . Thyroid disease Mother   . Diabetes Sister   . Stroke Sister   . Heart disease Brother   . Cancer Sister    History  Substance Use Topics  . Smoking status: Current Every Day Smoker -- 0.50 packs/day    Types: Cigarettes  . Smokeless tobacco: Not on file  . Alcohol Use: No   OB History   Grav Para Term Preterm Abortions TAB SAB Ect Mult Living                 Review of Systems  Constitutional: Negative for fever and chills.  Musculoskeletal: Positive for back pain, myalgias and neck pain. Negative for gait problem.  Skin: Negative for color change and rash.  Neurological: Negative for numbness.      Allergies  Review of patient's allergies indicates  no known allergies.  Home Medications   Prior to Admission medications   Medication Sig Start Date End Date Taking? Authorizing Provider  HYDROcodone-homatropine (HYCODAN) 5-1.5 MG/5ML syrup Take 5 mLs by mouth every 6 (six) hours as needed for cough. 05/24/13   Antony MaduraKelly Humes, PA-C  pseudoephedrine (SUDAFED) 30 MG tablet Take 30 mg by mouth every 4 (four) hours as needed for congestion.    Historical Provider, MD   BP 131/79  Pulse 82  Temp(Src) 98 F (36.7 C) (Oral)  Resp 18  SpO2 100%  LMP 12/05/2013 Physical Exam  Nursing note and vitals reviewed. Constitutional: She is oriented to person, place, and time. She appears well-developed and well-nourished. No distress.  HENT:  Head: Normocephalic and atraumatic.  Eyes: EOM are normal. Pupils are equal, round, and reactive to light. Right eye exhibits no discharge. Left eye exhibits no discharge.  Neck: Normal range of motion. Neck supple.  Pulmonary/Chest: Effort normal. No respiratory distress.  Abdominal: Soft. There is no tenderness.  Musculoskeletal:       Thoracic back: She exhibits tenderness.       Back:  No midline C-spine, T-spine, or L-spine tenderness with no step-offs, crepitus, or deformities noted. Mild paraspinal tenderness to palpation, no obvious spasm. Normal and equal strength and sensation to bilateral lower extremities.  Neurological: She is alert and oriented to person, place, and time.  Skin: Skin is  warm and dry. She is not diaphoretic.  Psychiatric: She has a normal mood and affect. Her behavior is normal.    ED Course  Procedures (including critical care time)  MDM   Final diagnoses:  Back strain  Sciatica   Patient with back pain, discomfort worsened by movement and palpation the paraspinal muscles. Likely strain, with exacerbation of her sciatica.  No neurological deficits and normal neuro exam.  Patient can walk but states is painful. No concern for cauda equina.  RICE protocol and pain medicine  indicated and discussed with patient.   Meds given in ED:  Medications  ketorolac (TORADOL) 30 MG/ML injection 30 mg (30 mg Intramuscular Given 12/29/13 0748)    New Prescriptions   CYCLOBENZAPRINE (FLEXERIL) 10 MG TABLET    Take 1 tablet (10 mg total) by mouth at bedtime.   HYDROCODONE-ACETAMINOPHEN (NORCO/VICODIN) 5-325 MG PER TABLET    Take 1 tablet by mouth every 4 (four) hours as needed.   IBUPROFEN (ADVIL,MOTRIN) 800 MG TABLET    Take 1 tablet (800 mg total) by mouth 3 (three) times daily. Take with food        Clabe SealLauren M Sparkles Mcneely, PA-C 12/29/13 [phone]

## 2013-12-29 NOTE — ED Provider Notes (Signed)
Medical screening examination/treatment/procedure(s) were conducted as a shared visit with non-physician practitioner(s) or resident  and myself.  I personally evaluated the patient during the encounter and agree with the findings and plan unless otherwise indicated.    I have personally reviewed any xrays and/ or EKG's with the provider and I agree with interpretation.   Patient was gradually worsening right mid back pain for the past 10 days. Pain worse with movement and palpation. 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  Patient denies history of back surgery, bulging discs, leg weakness or numbness, cancer, IV drug abuse, urinary changes or focal injuries. On exam patient has normal strength and sensation lower extremities bilateral, normal reflexes in lower extremities, tenderness and tight musculature right mid flank with no midline vertebral tenderness. Discussed supportive care and followup outpatient with reasons to return.  Back strain  Enid SkeensJoshua M Serrena Linderman, MD 12/29/13 [phone]

## 2013-12-29 NOTE — Discharge Instructions (Signed)
Call for a follow up appointment with a Family or Primary Care Provider.  Return if Symptoms worsen.   Take medication as prescribed.  Continue to stay active, walking and light stretching. Ice your back 3-4 times a day.    Emergency Department Resource Guide 1) Find a Doctor and Pay Out of Pocket Although you won't have to find out who is covered by your insurance plan, it is a good idea to ask around and get recommendations. You will then need to call the office and see if the doctor you have chosen will accept you as a new patient and what types of options they offer for patients who are self-pay. Some doctors offer discounts or will set up payment plans for their patients who do not have insurance, but you will need to ask so you aren't surprised when you get to your appointment.  2) Contact Your Local Health Department Not all health departments have doctors that can see patients for sick visits, but many do, so it is worth a call to see if yours does. If you don't know where your local health department is, you can check in your phone book. The CDC also has a tool to help you locate your state's health department, and many state websites also have listings of all of their local health departments.  3) Find a Walk-in Clinic If your illness is not likely to be very severe or complicated, you may want to try a walk in clinic. These are popping up all over the country in pharmacies, drugstores, and shopping centers. They're usually staffed by nurse practitioners or physician assistants that have been trained to treat common illnesses and complaints. They're usually fairly quick and inexpensive. However, if you have serious medical issues or chronic medical problems, these are probably not your best option.  No Primary Care Doctor: - Call Health Connect at  [phone] - they can help you locate a primary care doctor that  accepts your insurance, provides certain services, etc. - Physician Referral  Service- [phone]  Chronic Pain Problems: Organization         Address  Phone   Notes  [facility]  [phone] Patients need to be referred by their primary care doctor.   Medication Assistance: Organization         Address  Phone   Notes  [facility] [address] [place], [place] [address] [phone] --Must be a resident of [facility] [place] [place] -- Must have NO insurance coverage whatsoever (no Medicaid/ Medicare, etc.) -- The pt. MUST have a primary care doctor that directs their care regularly and follows them in the community   MedAssist  [phone]   [employer]  [phone]    Agencies that provide inexpensive medical care: Organization         Address  Phone   Notes  [facility]  [phone]   [facility]    [phone]   [facility] [address] [place], [place] [address] [phone]   [facility] [address], [place] [phone]   [facility]anned Parenthood    [phone]) [phone]   [facility]    [phone]   [facility] and [facility]  [address], [place] Phone:  [phone], Fax:  [phone] Hours of Operation:  9 am - 6 pm, M-F.  Also accepts Medicaid/Medicare  and self-pay.  [facility] for [place] [place], [address], [place] Phone: [phone], Fax: [phone]. Hours of Operation:  8:30 am - 5:30 pm, M-F.  Also accepts Medicaid and self-pay.  [facility] [address], [place] Phone: [phone]   [place], [place], [place] [phone], Ext. [phone] Mondays & Thursdays: 7-9 AM.  First 15 patients are seen on a first come, first serve basis.    [place] Providers:  Organization         Address  Phone   Notes  [facility] [address], [place] [phone] Also accepts self-pay patients.  [facility] [address], [place]  [phone]   [place], [address], [place] [phone]   [facility] [address] [place] [phone]   Lucianne Lei [address], [place]   [phone] Only accepts [place] Access [place] patients after they have their name applied to their card.   Self-Pay (no insurance) in [facility]:  Organization         Address  Phone   Notes  Sickle Cell Patients, [facility] [place] [phone]   [facility] [place] [phone]   [facility] [place]  [place], [place],  [phone]   [facility]/Dr. Osei-Bonsu  [address] [place] or [address], [place] [phone] Phone number for both [place] and [place] locations is the same.  [facility] [address] [place] [phone]   [facility] [address], [place] or [address] [phone]   [facility] [address], [place] [phone], phone; [phone], fax Sees patients 1st and 3rd Saturday of every month.  Must not qualify for public or private insurance (i.e. Medicaid, Medicare, [place] Health Choice, Veterans' Benefits)  Household income should be no more than 200% of the poverty level The clinic cannot treat you if you are pregnant or think you are pregnant  Sexually transmitted diseases are not treated at the clinic.    Dental Care: Organization         Address  Phone  Notes  [facility] Department of [facility] [place] [phone] Accepts children up to age 55 who are enrolled in  [place] or [place]; pregnant women with a Medicaid card; and children who have applied for Medicaid or [place] Health Choice, but were declined, whose parents can pay a reduced fee at time of service.  [facility] of [facility]  [address], [place] [phone] Accepts children up to age 27 who are enrolled in [place] or [place]; pregnant women with a Medicaid card; and children who have applied for Medicaid or [place] Health Choice, but were declined, whose parents can pay a reduced fee at time of service.  [place] Adult Dental Access PROGRAM  [place] [phone] Patients are seen by appointment only. Walk-ins are not accepted. [place] will see patients 6 years of age and older. Monday - Tuesday (8am-5pm) Most Wednesdays (  8:30-5pm) $30 per visit, cash only  [facility] Adult Dental Access PROGRAM  [address], [facility] [phone] Patients are seen by appointment only. Walk-ins are not accepted. [place] will see patients 72 years of age and older. One Wednesday Evening (Monthly: Volunteer Based).  $30 per visit, cash only  [place]  [phone] for adults; Children under age 24, call Graduate Pediatric Dentistry at [phone]. Children aged 47-14, please call [phone] to request a pediatric application.  Dental services are provided in all areas of dental care including fillings, crowns and bridges, complete and partial dentures, implants, gum treatment, root canals, and extractions. Preventive care is also provided. Treatment is provided to both adults and children. Patients are selected via a lottery and there is often a waiting list.   [facility] [address], [place]  [phone] [email or web address]   [facility] [address] [place], [place] [phone], Ext. [phone] Second and Fourth Thursday of each month, opens at 6:30  AM; Clinic ends at 9 AM.  Patients are seen on a first-come first-served basis, and a limited number are seen during each clinic.   [facility]  [address] Hillard Danker [place], [place] [phone]   Eligibility Requirements You must have lived in [place], [place], or [place] counties for at least the last three months.   You cannot be eligible for state or federal sponsored [employer], including [employer], [place], or [employer].   You generally cannot be eligible for healthcare insurance through your employer.    How to apply: Eligibility screenings are held every Tuesday and Wednesday afternoon from 1:00 pm until 4:00 pm. You do not need an appointment for the interview!  [facility] [address] [place], [place]   [place]  [place] Department  [place]  [phone]    Behavioral Health Resources in the Community: Intensive Outpatient Programs Organization         Address  Phone  Notes  [place]. [address] [place], [place] [phone]   [facility] Outpatient [address] [place], [place]   ADS: Alcohol & Drug Svcs [address] [place], [place]   [place] [address],  [place], [place] or [phone]   Substance Abuse Resources Organization         Address  Phone  Notes  Alcohol and Drug Services  [phone]   [place]  [phone]   [facility] [place]   Chinita Pester  [phone]   Residential & Outpatient Substance Abuse Program  [phone]   Psychological Services Organization         Address  Phone  Notes  [facility] [place]  [place]  [phone]   [place] [address] [place] or  [phone]    Mobile Crisis Teams Organization         Address  Phone  Notes  [facility], Mobile Crisis Care Unit  [phone]   [facility]  [address] [place], [place]   [place] Levels [address], [place] [phone]    Self-Help/Support Groups Organization         Address  Phone  Notes  [facility] [place] - variety of support groups  [place] Call for more information  Narcotics Anonymous (NA), [facility]aring [facility] [address], [employer] [place]  2 meetings at this location   Special educational needs teacher         Address  Phone  Notes  ASAP Residential Treatment [place],    [place]  [phone]   [facility]  [address] [place] [id number], [place], [place]   [facility] [place], [place] [phone] Admissions: 8am-3pm M-F  Incentives Substance [place] [address]    [place], [place] [phone]   [facility] [address] [place], [place], [place]   [facility] [address],  [place], [place]   [facility]ight Programs - Intensive Outpatient [address] Kristeen Mans [address], [place], [place]   [facility] ([place] [address].,  [place], [place] [phone] or [phone]   [facility] Services (RTS) [address], [place], [place] Accepts Medicaid  [facility] [place] [address]  [place] [phone] Substance Abuse/Addiction Treatment   [facility] Organization         Address  Phone  Notes  [facility]  [phone]   Domenic Schwab, PhD [address] Arlis Porta [place], [place]   [phone] or [phone]   [place], [place] [phone]   [facility] [address],  [place], [place] [phone] Insurance/Medicaid/sponsorship through [facility] and Families [address] [place]                                    [place], [place] [phone] [place] [address] [place], [place] [phone]    Dr. Adele Schilder  [phone]   [facility] Dept. 1) [address] [place] 2) [place] 3)  [place] [address], [place] [phone]  [phone]   [place] [phone] or [phone] (After Hours)

## 2013-12-29 NOTE — ED Notes (Addendum)
Pt c/o increasing R side mid to lower back pain x 1 week.  Pain score 10/10, increasing w/ coughing or movement.  Pt denies injury, but sts "I have a handicapped daughter, so I might have strained it."  Sts "it feels like a muscle spasm."  Pt has not tried anything for relief.  Denies GU issues.

## 2013-12-29 NOTE — ED Notes (Signed)
Pt alert, nad, ambulates to discharge, denies needs

## 2014-03-14 ENCOUNTER — Emergency Department (HOSPITAL_COMMUNITY): Payer: BC Managed Care – PPO

## 2014-03-14 ENCOUNTER — Emergency Department (HOSPITAL_COMMUNITY)
Admission: EM | Admit: 2014-03-14 | Discharge: 2014-03-14 | Disposition: A | Discharge status: Home / Self Care | Payer: BC Managed Care – PPO | Attending: Emergency Medicine | Admitting: Emergency Medicine

## 2014-03-14 ENCOUNTER — Encounter (HOSPITAL_COMMUNITY): Payer: Self-pay | Admitting: Emergency Medicine

## 2014-03-14 DIAGNOSIS — S93409A Sprain of unspecified ligament of unspecified ankle, initial encounter: Secondary | ICD-10-CM | POA: Insufficient documentation

## 2014-03-14 DIAGNOSIS — M25562 Pain in left knee: Secondary | ICD-10-CM

## 2014-03-14 DIAGNOSIS — Z791 Long term (current) use of non-steroidal anti-inflammatories (NSAID): Secondary | ICD-10-CM | POA: Insufficient documentation

## 2014-03-14 DIAGNOSIS — S93402A Sprain of unspecified ligament of left ankle, initial encounter: Secondary | ICD-10-CM

## 2014-03-14 DIAGNOSIS — Y9289 Other specified places as the place of occurrence of the external cause: Secondary | ICD-10-CM | POA: Insufficient documentation

## 2014-03-14 DIAGNOSIS — X500XXA Overexertion from strenuous movement or load, initial encounter: Secondary | ICD-10-CM | POA: Insufficient documentation

## 2014-03-14 DIAGNOSIS — F17200 Nicotine dependence, unspecified, uncomplicated: Secondary | ICD-10-CM | POA: Insufficient documentation

## 2014-03-14 DIAGNOSIS — Y9389 Activity, other specified: Secondary | ICD-10-CM | POA: Insufficient documentation

## 2014-03-14 DIAGNOSIS — Y990 Civilian activity done for income or pay: Secondary | ICD-10-CM | POA: Insufficient documentation

## 2014-03-14 MED ORDER — NAPROXEN 500 MG PO TABS: 500.0000 mg | ORAL_TABLET | Freq: Two times a day (BID) | ORAL | Status: DC

## 2014-03-14 MED ORDER — HYDROCODONE-ACETAMINOPHEN 5-325 MG PO TABS: 1.0000 | ORAL_TABLET | ORAL | Status: DC | PRN

## 2014-03-14 MED ORDER — IBUPROFEN 200 MG PO TABS
600.0000 mg | ORAL_TABLET | Freq: Once | ORAL | Status: AC
  Administered 2014-03-14: 600 mg via ORAL
  Filled 2014-03-14: qty 3

## 2014-03-14 MED ORDER — HYDROCODONE-ACETAMINOPHEN 5-325 MG PO TABS
1.0000 | ORAL_TABLET | Freq: Once | ORAL | Status: AC
  Administered 2014-03-14: 1 via ORAL
  Filled 2014-03-14: qty 1

## 2014-03-14 NOTE — ED Provider Notes (Signed)
CSN: [id number]     Arrival date & time 03/14/14  1138 History   First MD Initiated Contact with Patient 03/14/14 1148    This chart was scribed for non-physician practitioner Elpidio Anis, PA-C working with Ward Givens, MD, by Andrew Au, ED Scribe. This patient was seen in room WTR6/WTR6 and the patient's care was started at 12:08 PM. Chief Complaint  Patient presents with  . Knee Injury  . Ankle Injury    Patient is a 48 y.o. female presenting with lower extremity injury. The history is provided by the patient. No language interpreter was used.  Ankle Injury   Rachael Robinson is a 48 y.o. female who presents to the Emergency Department complaining of worsening, constant left knee and left ankle injury that occurred  today. Pt states she slipped at work, twisted her ankle and landed on her back.  Pt now has pain to left knee and ankle worsened when bearing weight to left knee and ankle. She reports she has taken 200mg  OTC ibuprofen this morning. She reports h/o of left knee pain in the past while in high school. She denies allergies to medications. She denies back pain. 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   History reviewed. No pertinent past medical history. Past Surgical History  Procedure Laterality Date  . Tubal ligation    . Knee surgery     Family History  Problem Relation Age of Onset  . Heart disease Mother   . Diabetes Mother   . Hyperlipidemia Mother   . Kidney disease Mother   . Thyroid disease Mother   . Diabetes Sister   . Stroke Sister   . Heart disease Brother   . Cancer Sister    History  Substance Use Topics  . Smoking status: Current Every Day Smoker -- 0.50 packs/day    Types: Cigarettes  . Smokeless tobacco: Not on file  . Alcohol Use: No   OB History   Grav Para Term Preterm Abortions TAB SAB Ect Mult Living                 Review of Systems  Constitutional: Negative for fever and chills.  Musculoskeletal: Positive for arthralgias,  gait problem and myalgias. Negative for back pain.  Skin: Negative for wound.   Allergies  Review of patient's allergies indicates no known allergies.  Home Medications   Prior to Admission medications   Medication Sig Start Date End Date Taking? Authorizing Provider  cyclobenzaprine (FLEXERIL) 10 MG tablet Take 1 tablet (10 mg total) by mouth at bedtime. 12/29/13   Lauren Doretha Imus, PA-C  HYDROcodone-acetaminophen (NORCO/VICODIN) 5-325 MG per tablet Take 1 tablet by mouth every 4 (four) hours as needed. 12/29/13   Lauren Doretha Imus, PA-C  ibuprofen (ADVIL,MOTRIN) 200 MG tablet Take 600 mg by mouth every 6 (six) hours as needed for moderate pain.    Historical Provider, MD  ibuprofen (ADVIL,MOTRIN) 800 MG tablet Take 1 tablet (800 mg total) by mouth 3 (three) times daily. Take with food 12/29/13   Clabe Seal, PA-C   BP 149/83  Pulse 85  Temp(Src) 97.8 F (36.6 C) (Oral)  Resp 20  SpO2 98%  LMP 02/21/2014 Physical Exam  Nursing note and vitals reviewed. Constitutional: She is oriented to person, place, and time. She appears well-developed and well-nourished. No distress.  HENT:  Head: Normocephalic and atraumatic.  Eyes: Conjunctivae and EOM are normal.  Neck: Neck supple.  Cardiovascular: Normal rate and intact  distal pulses.   Pulmonary/Chest: Effort normal.  Musculoskeletal: Normal range of motion.  Left ankle swollen laterally with no discoloration.  Left knee without significant swelling or discoloration. No calf or thigh tenderness.   Neurological: She is alert and oriented to person, place, and time.  Skin: Skin is warm and dry.  Psychiatric: She has a normal mood and affect. Her behavior is normal.    ED Course  Procedures  Labs Review Labs Reviewed - No data to display  Imaging Review No results found.   EKG Interpretation None      MDM   Final diagnoses:  None  1. Left knee pain 2. Left ankle sprain  X-rays negative. Exam stable. Supportive care provided  along with pain management. Recommend ortho follow up for any persistent pain.  I personally performed the services described in this documentation, which was scribed in my presence. The recorded information has been reviewed and is accurate.      Arnoldo HookerShari A Yuan Gann, PA-C 03/14/14 1247

## 2014-03-14 NOTE — ED Provider Notes (Signed)
Medical screening examination/treatment/procedure(s) were performed by non-physician practitioner and as supervising physician I was immediately available for consultation/collaboration.   EKG Interpretation None      Devoria AlbeIva Arvilla Salada, MD, Armando GangFACEP   Ward GivensIva L Laurey Salser, MD 03/14/14 1249

## 2014-03-14 NOTE — ED Notes (Signed)
Pt presents with c/o left knee injury and left ankle injury. Pt reports that she fell at work earlier today and injured both her left knee and left ankle. Also c/o some back pain. Pt says that has had trouble walking since the fall.

## 2014-03-14 NOTE — Discharge Instructions (Signed)
Ankle Sprain °An ankle sprain is an injury to the strong, fibrous tissues (ligaments) that hold the bones of your ankle joint together.  °CAUSES °An ankle sprain is usually caused by a fall or by twisting your ankle. Ankle sprains most commonly occur when you step on the outer edge of your foot, and your ankle turns inward. People who participate in sports are more prone to these types of injuries.  °SYMPTOMS  °· Pain in your ankle. The pain may be present at rest or only when you are trying to stand or walk. °· Swelling. °· Bruising. Bruising may develop immediately or within 1 to 2 days after your injury. °· Difficulty standing or walking, particularly when turning corners or changing directions. °DIAGNOSIS  °Your caregiver will ask you details about your injury and perform a physical exam of your ankle to determine if you have an ankle sprain. During the physical exam, your caregiver will press on and apply pressure to specific areas of your foot and ankle. Your caregiver will try to move your ankle in certain ways. An X-ray exam may be done to be sure a bone was not broken or a ligament did not separate from one of the bones in your ankle (avulsion fracture).  °TREATMENT  °Certain types of braces can help stabilize your ankle. Your caregiver can make a recommendation for this. Your caregiver may recommend the use of medicine for pain. If your sprain is severe, your caregiver may refer you to a surgeon who helps to restore function to parts of your skeletal system (orthopedist) or a physical therapist. °HOME CARE INSTRUCTIONS  °· Apply ice to your injury for 1-2 days or as directed by your caregiver. Applying ice helps to reduce inflammation and pain. °¨ Put ice in a plastic bag. °¨ Place a towel between your skin and the bag. °¨ Leave the ice on for 15-20 minutes at a time, every 2 hours while you are awake. °· Only take over-the-counter or prescription medicines for pain, discomfort, or fever as directed by  your caregiver. °· Elevate your injured ankle above the level of your heart as much as possible for 2-3 days. °· If your caregiver recommends crutches, use them as instructed. Gradually put weight on the affected ankle. Continue to use crutches or a cane until you can walk without feeling pain in your ankle. °· If you have a plaster splint, wear the splint as directed by your caregiver. Do not rest it on anything harder than a pillow for the first 24 hours. Do not put weight on it. Do not get it wet. You may take it off to take a shower or bath. °· You may have been given an elastic bandage to wear around your ankle to provide support. If the elastic bandage is too tight (you have numbness or tingling in your foot or your foot becomes cold and blue), adjust the bandage to make it comfortable. °· If you have an air splint, you may blow more air into it or let air out to make it more comfortable. You may take your splint off at night and before taking a shower or bath. Wiggle your toes in the splint several times per day to decrease swelling. °SEEK MEDICAL CARE IF:  °· You have rapidly increasing bruising or swelling. °· Your toes feel extremely cold or you lose feeling in your foot. °· Your pain is not relieved with medicine. °SEEK IMMEDIATE MEDICAL CARE IF: °· Your toes are numb or blue. °·   You have severe pain that is increasing. MAKE SURE YOU:   Understand these instructions.  Will watch your condition.  Will get help right away if you are not doing well or get worse. Document Released: 08/10/2005 Document Revised: 05/04/2012 Document Reviewed: 08/22/2011 [facility] Patient Information 2015 [place], [place]. This information is not intended to replace advice given to you by your health care provider. Make sure you discuss any questions you have with your health care provider. Cryotherapy Cryotherapy means treatment with cold. Ice or gel packs can be used to reduce both pain and swelling. Ice is the most  helpful within the first 24 to 48 hours after an injury or flareup from overusing a muscle or joint. Sprains, strains, spasms, burning pain, shooting pain, and aches can all be eased with ice. Ice can also be used when recovering from surgery. Ice is effective, has very few side effects, and is safe for most people to use. PRECAUTIONS  Ice is not a safe treatment option for people with:  Raynaud's phenomenon. This is a condition affecting small blood vessels in the extremities. Exposure to cold may cause your problems to return.  Cold hypersensitivity. There are many forms of cold hypersensitivity, including:  Cold urticaria. Red, itchy hives appear on the skin when the tissues begin to warm after being iced.  Cold erythema. This is a red, itchy rash caused by exposure to cold.  Cold hemoglobinuria. Red blood cells break down when the tissues begin to warm after being iced. The hemoglobin that carry oxygen are passed into the urine because they cannot combine with blood proteins fast enough.  Numbness or altered sensitivity in the area being iced. If you have any of the following conditions, do not use ice until you have discussed cryotherapy with your caregiver:  Heart conditions, such as arrhythmia, angina, or chronic heart disease.  High blood pressure.  Healing wounds or open skin in the area being iced.  Current infections.  Rheumatoid arthritis.  Poor circulation.  Diabetes. Ice slows the blood flow in the region it is applied. This is beneficial when trying to stop inflamed tissues from spreading irritating chemicals to surrounding tissues. However, if you expose your skin to cold temperatures for too long or without the proper protection, you can damage your skin or nerves. Watch for signs of skin damage due to cold. HOME CARE INSTRUCTIONS Follow these tips to use ice and cold packs safely.  Place a dry or damp towel between the ice and skin. A damp towel will cool the skin  more quickly, so you may need to shorten the time that the ice is used.  For a more rapid response, add gentle compression to the ice.  Ice for no more than 10 to 20 minutes at a time. The bonier the area you are icing, the less time it will take to get the benefits of ice.  Check your skin after 5 minutes to make sure there are no signs of a poor response to cold or skin damage.  Rest 20 minutes or more in between uses.  Once your skin is numb, you can end your treatment. You can test numbness by very lightly touching your skin. The touch should be so light that you do not see the skin dimple from the pressure of your fingertip. When using ice, most people will feel these normal sensations in this order: cold, burning, aching, and numbness.  Do not use ice on someone who cannot communicate their responses to  pain, such as small children or people with dementia. HOW TO MAKE AN ICE PACK Ice packs are the most common way to use ice therapy. Other methods include ice massage, ice baths, and cryo-sprays. Muscle creams that cause a cold, tingly feeling do not offer the same benefits that ice offers and should not be used as a substitute unless recommended by your caregiver. To make an ice pack, do one of the following:  Place crushed ice or a bag of frozen vegetables in a sealable plastic bag. Squeeze out the excess air. Place this bag inside another plastic bag. Slide the bag into a pillowcase or place a damp towel between your skin and the bag.  Mix 3 parts water with 1 part rubbing alcohol. Freeze the mixture in a sealable plastic bag. When you remove the mixture from the freezer, it will be slushy. Squeeze out the excess air. Place this bag inside another plastic bag. Slide the bag into a pillowcase or place a damp towel between your skin and the bag. SEEK MEDICAL CARE IF:  You develop white spots on your skin. This may give the skin a blotchy (mottled) appearance.  Your skin turns blue or  pale.  Your skin becomes waxy or hard.  Your swelling gets worse. MAKE SURE YOU:   Understand these instructions.  Will watch your condition.  Will get help right away if you are not doing well or get worse. Document Released: 04/06/2011 Document Revised: 11/02/2011 Document Reviewed: 04/06/2011 [facility] Patient Information 2015 [place], [place]. This information is not intended to replace advice given to you by your health care provider. Make sure you discuss any questions you have with your health care provider. Knee Pain The knee is the complex joint between your thigh and your lower leg. It is made up of bones, tendons, ligaments, and cartilage. The bones that make up the knee are:  The femur in the thigh.  The tibia and fibula in the lower leg.  The patella or kneecap riding in the groove on the lower femur. CAUSES  Knee pain is a common complaint with many causes. A few of these causes are:  Injury, such as:  A ruptured ligament or tendon injury.  Torn cartilage.  Medical conditions, such as:  Gout  Arthritis  Infections  Overuse, over training, or overdoing a physical activity. Knee pain can be minor or severe. Knee pain can accompany debilitating injury. Minor knee problems often respond well to self-care measures or get well on their own. More serious injuries may need medical intervention or even surgery. SYMPTOMS The knee is complex. Symptoms of knee problems can vary widely. Some of the problems are:  Pain with movement and weight bearing.  Swelling and tenderness.  Buckling of the knee.  Inability to straighten or extend your knee.  Your knee locks and you cannot straighten it.  Warmth and redness with pain and fever.  Deformity or dislocation of the kneecap. DIAGNOSIS  Determining what is wrong may be very straight forward such as when there is an injury. It can also be challenging because of the complexity of the knee. Tests to make a diagnosis  may include:  Your caregiver taking a history and doing a physical exam.  Routine X-rays can be used to rule out other problems. X-rays will not reveal a cartilage tear. Some injuries of the knee can be diagnosed by:  Arthroscopy a surgical technique by which a small video camera is inserted through tiny incisions on the  sides of the knee. This procedure is used to examine and repair internal knee joint problems. Tiny instruments can be used during arthroscopy to repair the torn knee cartilage (meniscus).  Arthrography is a radiology technique. A contrast liquid is directly injected into the knee joint. Internal structures of the knee joint then become visible on X-ray film.  An MRI scan is a non X-ray radiology procedure in which magnetic fields and a computer produce two- or three-dimensional images of the inside of the knee. Cartilage tears are often visible using an MRI scanner. MRI scans have largely replaced arthrography in diagnosing cartilage tears of the knee.  Blood work.  Examination of the fluid that helps to lubricate the knee joint (synovial fluid). This is done by taking a sample out using a needle and a syringe. TREATMENT The treatment of knee problems depends on the cause. Some of these treatments are:  Depending on the injury, proper casting, splinting, surgery, or physical therapy care will be needed.  Give yourself adequate recovery time. Do not overuse your joints. If you begin to get sore during workout routines, back off. Slow down or do fewer repetitions.  For repetitive activities such as cycling or running, maintain your strength and nutrition.  Alternate muscle groups. For example, if you are a weight lifter, work the upper body on one day and the lower body the next.  Either tight or weak muscles do not give the proper support for your knee. Tight or weak muscles do not absorb the stress placed on the knee joint. Keep the muscles surrounding the knee  strong.  Take care of mechanical problems.  If you have flat feet, orthotics or special shoes may help. See your caregiver if you need help.  Arch supports, sometimes with wedges on the inner or outer aspect of the heel, can help. These can shift pressure away from the side of the knee most bothered by osteoarthritis.  A brace called an "unloader" brace also may be used to help ease the pressure on the most arthritic side of the knee.  If your caregiver has prescribed crutches, braces, wraps or ice, use as directed. The acronym for this is PRICE. This means protection, rest, ice, compression, and elevation.  Nonsteroidal anti-inflammatory drugs (NSAIDs), can help relieve pain. But if taken immediately after an injury, they may actually increase swelling. Take NSAIDs with food in your stomach. Stop them if you develop stomach problems. Do not take these if you have a history of ulcers, stomach pain, or bleeding from the bowel. Do not take without your caregiver's approval if you have problems with fluid retention, heart failure, or kidney problems.  For ongoing knee problems, physical therapy may be helpful.  Glucosamine and chondroitin are over-the-counter dietary supplements. Both may help relieve the pain of osteoarthritis in the knee. These medicines are different from the usual anti-inflammatory drugs. Glucosamine may decrease the rate of cartilage destruction.  Injections of a corticosteroid drug into your knee joint may help reduce the symptoms of an arthritis flare-up. They may provide pain relief that lasts a few months. You may have to wait a few months between injections. The injections do have a small increased risk of infection, water retention, and elevated blood sugar levels.  Hyaluronic acid injected into damaged joints may ease pain and provide lubrication. These injections may work by reducing inflammation. A series of shots may give relief for as long as 6 months.  Topical  painkillers. Applying certain ointments to your skin  may help relieve the pain and stiffness of osteoarthritis. Ask your pharmacist for suggestions. Many over the-counter products are approved for temporary relief of arthritis pain.  In some countries, doctors often prescribe topical NSAIDs for relief of chronic conditions such as arthritis and tendinitis. A review of treatment with NSAID creams found that they worked as well as oral medications but without the serious side effects. PREVENTION  Maintain a healthy weight. Extra pounds put more strain on your joints.  Get strong, stay limber. Weak muscles are a common cause of knee injuries. Stretching is important. Include flexibility exercises in your workouts.  Be smart about exercise. If you have osteoarthritis, chronic knee pain or recurring injuries, you may need to change the way you exercise. This does not mean you have to stop being active. If your knees ache after jogging or playing basketball, consider switching to swimming, water aerobics, or other low-impact activities, at least for a few days a week. Sometimes limiting high-impact activities will provide relief.  Make sure your shoes fit well. Choose footwear that is right for your sport.  Protect your knees. Use the proper gear for knee-sensitive activities. Use kneepads when playing volleyball or laying carpet. Buckle your seat belt every time you drive. Most shattered kneecaps occur in car accidents.  Rest when you are tired. SEEK MEDICAL CARE IF:  You have knee pain that is continual and does not seem to be getting better.  SEEK IMMEDIATE MEDICAL CARE IF:  Your knee joint feels hot to the touch and you have a high fever. MAKE SURE YOU:   Understand these instructions.  Will watch your condition.  Will get help right away if you are not doing well or get worse. Document Released: 06/07/2007 Document Revised: 11/02/2011 Document Reviewed: 06/07/2007 [facility] Patient  Information 2015 [place], [place]. This information is not intended to replace advice given to you by your health care provider. Make sure you discuss any questions you have with your health care provider.

## 2014-04-11 ENCOUNTER — Emergency Department (HOSPITAL_COMMUNITY)
Admission: EM | Admit: 2014-04-11 | Discharge: 2014-04-11 | Disposition: A | Discharge status: Home / Self Care | Payer: BC Managed Care – PPO | Attending: Emergency Medicine | Admitting: Emergency Medicine

## 2014-04-11 ENCOUNTER — Encounter (HOSPITAL_COMMUNITY): Payer: Self-pay | Admitting: Emergency Medicine

## 2014-04-11 DIAGNOSIS — R42 Dizziness and giddiness: Secondary | ICD-10-CM | POA: Insufficient documentation

## 2014-04-11 DIAGNOSIS — Z3202 Encounter for pregnancy test, result negative: Secondary | ICD-10-CM | POA: Insufficient documentation

## 2014-04-11 DIAGNOSIS — F17200 Nicotine dependence, unspecified, uncomplicated: Secondary | ICD-10-CM | POA: Insufficient documentation

## 2014-04-11 LAB — CBC WITH DIFFERENTIAL/PLATELET
Basophils Absolute: 0 10*3/uL (ref 0.0–0.1)
Basophils Relative: 0 % (ref 0–1)
EOS ABS: 0.1 10*3/uL (ref 0.0–0.7)
Eosinophils Relative: 2 % (ref 0–5)
HCT: 40.3 % (ref 36.0–46.0)
Hemoglobin: 13.7 g/dL (ref 12.0–15.0)
LYMPHS ABS: 1.7 10*3/uL (ref 0.7–4.0)
LYMPHS PCT: 23 % (ref 12–46)
MCH: 33.3 pg (ref 26.0–34.0)
MCHC: 34 g/dL (ref 30.0–36.0)
MCV: 97.8 fL (ref 78.0–100.0)
Monocytes Absolute: 0.4 10*3/uL (ref 0.1–1.0)
Monocytes Relative: 6 % (ref 3–12)
NEUTROS ABS: 5 10*3/uL (ref 1.7–7.7)
NEUTROS PCT: 69 % (ref 43–77)
PLATELETS: 182 10*3/uL (ref 150–400)
RBC: 4.12 MIL/uL (ref 3.87–5.11)
RDW: 12.5 % (ref 11.5–15.5)
WBC: 7.2 10*3/uL (ref 4.0–10.5)

## 2014-04-11 LAB — COMPREHENSIVE METABOLIC PANEL
ALK PHOS: 73 U/L (ref 39–117)
ALT: 24 U/L (ref 0–35)
AST: 23 U/L (ref 0–37)
Albumin: 3.7 g/dL (ref 3.5–5.2)
Anion gap: 15 (ref 5–15)
BUN: 12 mg/dL (ref 6–23)
CO2: 22 mEq/L (ref 19–32)
Calcium: 9.3 mg/dL (ref 8.4–10.5)
Chloride: 102 mEq/L (ref 96–112)
Creatinine, Ser: 0.8 mg/dL (ref 0.50–1.10)
GFR calc non Af Amer: 86 mL/min — ABNORMAL LOW (ref >90)
GLUCOSE: 85 mg/dL (ref 70–99)
POTASSIUM: 4.2 meq/L (ref 3.7–5.3)
SODIUM: 139 meq/L (ref 137–147)
TOTAL PROTEIN: 7.2 g/dL (ref 6.0–8.3)
Total Bilirubin: 0.3 mg/dL (ref 0.3–1.2)

## 2014-04-11 LAB — POC URINE PREG, ED: Preg Test, Ur: NEGATIVE

## 2014-04-11 MED ORDER — SODIUM CHLORIDE 0.9 % IV BOLUS (SEPSIS)
1000.0000 mL | Freq: Once | INTRAVENOUS | Status: AC
  Administered 2014-04-11: 1000 mL via INTRAVENOUS

## 2014-04-11 MED ORDER — MECLIZINE HCL 25 MG PO TABS: 25.0000 mg | ORAL_TABLET | Freq: Three times a day (TID) | ORAL | Status: AC

## 2014-04-11 NOTE — ED Notes (Signed)
Pt passed neuro exam in triage.  No unilateral changes in grips, smile, hand or feet dexterity.  No change in speech.  No change in orientation.

## 2014-04-11 NOTE — ED Notes (Addendum)
Initial contact-A&Ox4. Moving all extremities equally. C/o dizziness today but denies chest pain and SOB. Also had near syncopal episode with no pain. MD at bedside. Has had recent familial issue that patient feels is contributing to situation. Patient tearful but doesn't want to discuss situation or talk to SW with son in room. No cardiac hx but has familial cardiac hx. Hooked up to EKG immediately. No other questions/concerns.

## 2014-04-11 NOTE — ED Notes (Addendum)
Pt with dizziness starting at around 1 pm.  Pt came home from break at work and noted difficulty walking.  No vomiting.  Some nausea.  No chest pain.  No headache.  Pt states dizziness worse with ambulation and feels hot and flushed at same time.

## 2014-04-11 NOTE — ED Notes (Signed)
Aware urine sample is needed. 

## 2014-04-11 NOTE — Discharge Instructions (Signed)
As discussed, your evaluation today has been largely reassuring.  But, it is important that you monitor your condition carefully, and do not hesitate to return to the ED if you develop new, or concerning changes in your condition.  Please take all medication as directed.  Otherwise, please follow-up with your physician for appropriate ongoing care.    Dizziness Dizziness is a common problem. It is a feeling of unsteadiness or light-headedness. You may feel like you are about to faint. Dizziness can lead to injury if you stumble or fall. A person of any age group can suffer from dizziness, but dizziness is more common in older adults. CAUSES  Dizziness can be caused by many different things, including:  Middle ear problems.  Standing for too long.  Infections.  An allergic reaction.  Aging.  An emotional response to something, such as the sight of blood.  Side effects of medicines.  Tiredness.  Problems with circulation or blood pressure.  Excessive use of alcohol or medicines, or illegal drug use.  Breathing too fast (hyperventilation).  An irregular heart rhythm (arrhythmia).  A low red blood cell count (anemia).  Pregnancy.  Vomiting, diarrhea, fever, or other illnesses that cause body fluid loss (dehydration).  Diseases or conditions such as Parkinson's disease, high blood pressure (hypertension), diabetes, and thyroid problems.  Exposure to extreme heat. DIAGNOSIS  Your health care provider will ask about your symptoms, perform a physical exam, and perform an electrocardiogram (ECG) to record the electrical activity of your heart. Your health care provider may also perform other heart or blood tests to determine the cause of your dizziness. These may include:  Transthoracic echocardiogram (TTE). During echocardiography, sound waves are used to evaluate how blood flows through your heart.  Transesophageal echocardiogram (TEE).  Cardiac monitoring. This allows  your health care provider to monitor your heart rate and rhythm in real time.  Holter monitor. This is a portable device that records your heartbeat and can help diagnose heart arrhythmias. It allows your health care provider to track your heart activity for several days if needed.  Stress tests by exercise or by giving medicine that makes the heart beat faster. TREATMENT  Treatment of dizziness depends on the cause of your symptoms and can vary greatly. HOME CARE INSTRUCTIONS   Drink enough fluids to keep your urine clear or pale yellow. This is especially important in very hot weather. In older adults, it is also important in cold weather.  Take your medicine exactly as directed if your dizziness is caused by medicines. When taking blood pressure medicines, it is especially important to get up slowly.  Rise slowly from chairs and steady yourself until you feel okay.  In the morning, first sit up on the side of the bed. When you feel okay, stand slowly while holding onto something until you know your balance is fine.  Move your legs often if you need to stand in one place for a long time. Tighten and relax your muscles in your legs while standing.  Have someone stay with you for 1-2 days if dizziness continues to be a problem. Do this until you feel you are well enough to stay alone. Have the person call your health care provider if he or she notices changes in you that are concerning.  Do not drive or use heavy machinery if you feel dizzy.  Do not drink alcohol. SEEK IMMEDIATE MEDICAL CARE IF:   Your dizziness or light-headedness gets worse.  You feel nauseous or  vomit.  You have problems talking, walking, or using your arms, hands, or legs.  You feel weak.  You are not thinking clearly or you have trouble forming sentences. It may take a friend or family member to notice this.  You have chest pain, abdominal pain, shortness of breath, or sweating.  Your vision changes.  You  notice any bleeding.  You have side effects from medicine that seems to be getting worse rather than better. MAKE SURE YOU:   Understand these instructions.  Will watch your condition.  Will get help right away if you are not doing well or get worse. Document Released: 02/03/2001 Document Revised: 08/15/2013 Document Reviewed: 02/27/2011 [facility] Patient Information 2015 [place], [place]. This information is not intended to replace advice given to you by your health care provider. Make sure you discuss any questions you have with your health care provider.

## 2014-04-11 NOTE — ED Notes (Addendum)
Unable to urinate at this time.  

## 2014-04-11 NOTE — ED Provider Notes (Signed)
CSN: [id number]     Arrival date & time 04/11/14  1326 History   First MD Initiated Contact with Patient 04/11/14 1340     Chief Complaint  Patient presents with  . Dizziness     HPI Patient presents after an episode of dizziness. Symptoms began approximately 2 hours prior to my evaluation.  The patient notes a subtle onset of dizziness, with mild lightheadedness, but no syncope, no falling. There was ongoing disequilibrium, but no asymmetric weakness, no pain, no dyspnea. No clear precipitant. Patient does endorse increasing stress at home, and at work. Patient smokes, was counseled on the need to stop doing this. Patient has no history of vertigo.  History reviewed. No pertinent past medical history. Past Surgical History  Procedure Laterality Date  . Tubal ligation    . Knee surgery     Family History  Problem Relation Age of Onset  . Heart disease Mother   . Diabetes Mother   . Hyperlipidemia Mother   . Kidney disease Mother   . Thyroid disease Mother   . Diabetes Sister   . Stroke Sister   . Heart disease Brother   . Cancer Sister    History  Substance Use Topics  . Smoking status: Current Every Day Smoker -- 0.50 packs/day    Types: Cigarettes  . Smokeless tobacco: Not on file  . Alcohol Use: No   OB History   Grav Para Term Preterm Abortions TAB SAB Ect Mult Living                 Review of Systems  Constitutional:       Per HPI, otherwise negative  HENT:       Per HPI, otherwise negative  Respiratory:       Per HPI, otherwise negative  Cardiovascular:       Per HPI, otherwise negative  Gastrointestinal: Negative for vomiting.  Endocrine:       Negative aside from HPI  Genitourinary:       Neg aside from HPI   Musculoskeletal:       Per HPI, otherwise negative  Skin: Negative.   Neurological: Positive for dizziness and light-headedness. Negative for tremors, seizures, syncope, facial asymmetry, speech difficulty, numbness and headaches.       Allergies  Review of patient's allergies indicates no known allergies.  Home Medications   Prior to Admission medications   Medication Sig Start Date End Date Taking? Authorizing Provider  ibuprofen (ADVIL,MOTRIN) 200 MG tablet Take 400 mg by mouth every 6 (six) hours as needed for mild pain or moderate pain.   Yes Historical Provider, MD   BP 137/80  Pulse 89  Temp(Src) 98 F (36.7 C) (Oral)  Resp 18  SpO2 97%  LMP 02/21/2014 Physical Exam  Nursing note and vitals reviewed. Constitutional: She is oriented to person, place, and time. She appears well-developed and well-nourished. No distress.  HENT:  Head: Normocephalic and atraumatic.  Eyes: Conjunctivae and EOM are normal.  Cardiovascular: Normal rate and regular rhythm.   Pulmonary/Chest: Effort normal and breath sounds normal. No stridor. No respiratory distress.  Abdominal: She exhibits no distension.  Musculoskeletal: She exhibits no edema.  Neurological: She is alert and oriented to person, place, and time. She displays no atrophy and no tremor. No cranial nerve deficit or sensory deficit. She exhibits normal muscle tone. She displays no seizure activity. Coordination normal.  No provokable vertiginous symptoms  Skin: Skin is warm and dry.  Psychiatric: She has a  normal mood and affect.    ED Course  Procedures (including critical care time) Labs Reviewed   EKG Interpretation   Date/Time:  Wednesday April 11 2014 13:49:37 EDT Ventricular Rate:  87 PR Interval:  130 QRS Duration: 83 QT Interval:  371 QTC Calculation: 446 R Axis:   14 Text Interpretation:  Sinus rhythm Low voltage, precordial leads Sinus  rhythm Artifact Abnormal ekg Confirmed by Gerhard Munch  MD ([id number]) on  04/11/2014 2:21:06 PM       3:13 PM Patient in no distress.  She states that she feels better. I have reviewed findings thus far with her. MDM  Patient presents with dizziness. The patient's symptoms resolved in the  emergency department.  Patient is neurologically intact and hemodynamically stable. Patient's labs, EKG are largely reassuring. Patient has no return of her symptoms her. With no return of symptoms, no evidence of distress, there is low suspicion for ongoing arrhythmia, occult infection, or neurologic processes. Patient was discharged in stable condition to follow up with her primary care physician.      Gerhard Munch, MD 04/11/14 [phone]

## 2014-07-06 ENCOUNTER — Emergency Department (HOSPITAL_COMMUNITY)
Admission: EM | Admit: 2014-07-06 | Discharge: 2014-07-06 | Disposition: A | Discharge status: Home / Self Care | Payer: BC Managed Care – PPO | Attending: Emergency Medicine | Admitting: Emergency Medicine

## 2014-07-06 ENCOUNTER — Encounter (HOSPITAL_COMMUNITY): Payer: Self-pay | Admitting: Emergency Medicine

## 2014-07-06 DIAGNOSIS — Y9289 Other specified places as the place of occurrence of the external cause: Secondary | ICD-10-CM | POA: Insufficient documentation

## 2014-07-06 DIAGNOSIS — Z720 Tobacco use: Secondary | ICD-10-CM | POA: Insufficient documentation

## 2014-07-06 DIAGNOSIS — S39012A Strain of muscle, fascia and tendon of lower back, initial encounter: Secondary | ICD-10-CM

## 2014-07-06 DIAGNOSIS — S24109A Unspecified injury at unspecified level of thoracic spinal cord, initial encounter: Secondary | ICD-10-CM | POA: Insufficient documentation

## 2014-07-06 DIAGNOSIS — X58XXXA Exposure to other specified factors, initial encounter: Secondary | ICD-10-CM | POA: Insufficient documentation

## 2014-07-06 DIAGNOSIS — Y9389 Activity, other specified: Secondary | ICD-10-CM | POA: Insufficient documentation

## 2014-07-06 DIAGNOSIS — Y998 Other external cause status: Secondary | ICD-10-CM | POA: Insufficient documentation

## 2014-07-06 MED ORDER — METHOCARBAMOL 500 MG PO TABS: 500.0000 mg | ORAL_TABLET | Freq: Two times a day (BID) | ORAL | Status: DC

## 2014-07-06 MED ORDER — HYDROCODONE-ACETAMINOPHEN 5-325 MG PO TABS: 1.0000 | ORAL_TABLET | Freq: Four times a day (QID) | ORAL | Status: DC | PRN

## 2014-07-06 MED ORDER — IBUPROFEN 800 MG PO TABS: 800.0000 mg | ORAL_TABLET | Freq: Four times a day (QID) | ORAL | Status: DC | PRN

## 2014-07-06 NOTE — ED Notes (Signed)
Pt alert, nad, resp even unlabored, skin pwd, denies needs, ambulates to discharge 

## 2014-07-06 NOTE — ED Notes (Addendum)
Pt c/o back pain down spine and on right mid for about a week. Pt states pain subsided on Wed but now flared back up again. Pt states that she has special needs daughter in a wheelchair but hasnt been lifting her since she has been having back pain. Pt states that her back aches and then will have spasms esp when she lifts her right arm, cough or sneezes.

## 2014-07-06 NOTE — Discharge Instructions (Signed)

## 2014-07-06 NOTE — ED Provider Notes (Signed)
CSN: [id number]     Arrival date & time 07/06/14  1037 History   First MD Initiated Contact with Patient 07/06/14 1046     Chief Complaint  Patient presents with  . Back Pain     (Consider location/radiation/quality/duration/timing/severity/associated sxs/prior Treatment) HPI   48 year old female presents c/o R lower back pain x 1 week.  Described pain as constant, achy, with intermittent muscle spasm.  Pain worsen with movement, she has tried alternating heat/ice and ibuprofen with minimal relief. Pain has gotten progressively worse in the past 2 days.  Report having a special need child which requires extra assistance (lifting) which contributes to her back pain.  No radiating sxs, no fever chills n/v urinary frequency, dysuria, hematuria. No urinary or bowel incontinence. No recent hospitalization, no IVDU, no active cancer.  Pt without any other health problem. Pt has similar sxs like this in the past which relief with muscle relaxant.    History reviewed. No pertinent past medical history. Past Surgical History  Procedure Laterality Date  . Tubal ligation    . Knee surgery     Family History  Problem Relation Age of Onset  . Heart disease Mother   . Diabetes Mother   . Hyperlipidemia Mother   . Kidney disease Mother   . Thyroid disease Mother   . Diabetes Sister   . Stroke Sister   . Heart disease Brother   . Cancer Sister    History  Substance Use Topics  . Smoking status: Current Every Day Smoker -- 0.50 packs/day    Types: Cigarettes  . Smokeless tobacco: Not on file  . Alcohol Use: No   OB History    No data available     Review of Systems  Constitutional: Negative for fever.  Genitourinary: Negative for flank pain.  Musculoskeletal: Positive for back pain.  Skin: Negative for rash and wound.  Neurological: Negative for numbness.      Allergies  Review of patient's allergies indicates no known allergies.  Home Medications   Prior to Admission  medications   Medication Sig Start Date End Date Taking? Authorizing Provider  ibuprofen (ADVIL,MOTRIN) 200 MG tablet Take 400 mg by mouth every 6 (six) hours as needed for mild pain or moderate pain.    Historical Provider, MD   BP 116/80 mmHg  Pulse 98  Temp(Src) 97.7 F (36.5 C) (Oral)  SpO2 99% Physical Exam  Constitutional: She appears well-developed and well-nourished. No distress.  HENT:  Head: Atraumatic.  Eyes: Conjunctivae are normal.  Neck: Neck supple.  Cardiovascular: Normal rate, regular rhythm and intact distal pulses.   Pulmonary/Chest: Effort normal and breath sounds normal. No respiratory distress.  Abdominal: Soft. Bowel sounds are normal. There is no tenderness.  Genitourinary:  No CVA tenderness  Musculoskeletal: She exhibits tenderness (tenderness to R lower paralumbar region and lower thoracic.  Has FROM.  increasing pain with hip flexion.  no pain with SLR.).  Neurological: She is alert. She has normal reflexes.  Skin: No rash noted.  Psychiatric: She has a normal mood and affect.  Nursing note and vitals reviewed.   ED Course  Procedures (including critical care time)  Low back pain consistence with muscle strain. No red flags. Patient is able to ambulate. Plan to provide a short course of narcotic pain medication and muscle relaxant. Orthopedic referral as needed. Rice therapy discussed. Return precautions discussed. Patient voiced understanding and agrees with plan.  Labs Review Labs Reviewed - No data to display  Imaging Review No results found.   EKG Interpretation None      MDM   Final diagnoses:  Low back strain, initial encounter    BP 116/80 mmHg  Pulse 98  Temp(Src) 97.7 F (36.5 C) (Oral)  SpO2 99%     Fayrene HelperBowie Grasiela Jonsson, PA-C 07/06/14 1133  Flint MelterElliott L Wentz, MD 07/06/14 1615

## 2014-12-26 ENCOUNTER — Emergency Department (HOSPITAL_COMMUNITY)
Admission: EM | Admit: 2014-12-26 | Discharge: 2014-12-26 | Disposition: A | Discharge status: Home / Self Care | Payer: No Typology Code available for payment source | Attending: Emergency Medicine | Admitting: Emergency Medicine

## 2014-12-26 ENCOUNTER — Encounter (HOSPITAL_COMMUNITY): Payer: Self-pay

## 2014-12-26 DIAGNOSIS — M545 Low back pain, unspecified: Secondary | ICD-10-CM

## 2014-12-26 DIAGNOSIS — R110 Nausea: Secondary | ICD-10-CM | POA: Insufficient documentation

## 2014-12-26 DIAGNOSIS — Z720 Tobacco use: Secondary | ICD-10-CM | POA: Diagnosis not present

## 2014-12-26 DIAGNOSIS — Z79899 Other long term (current) drug therapy: Secondary | ICD-10-CM | POA: Diagnosis not present

## 2014-12-26 MED ORDER — DEXAMETHASONE SODIUM PHOSPHATE 10 MG/ML IJ SOLN
10.0000 mg | Freq: Once | INTRAMUSCULAR | Status: AC
  Administered 2014-12-26: 10 mg via INTRAMUSCULAR
  Filled 2014-12-26: qty 1

## 2014-12-26 MED ORDER — TRAMADOL HCL 50 MG PO TABS: 50.0000 mg | ORAL_TABLET | Freq: Four times a day (QID) | ORAL | Status: DC | PRN

## 2014-12-26 MED ORDER — CYCLOBENZAPRINE HCL 10 MG PO TABS: 10.0000 mg | ORAL_TABLET | Freq: Two times a day (BID) | ORAL | Status: DC | PRN

## 2014-12-26 MED ORDER — NAPROXEN 500 MG PO TABS: 500.0000 mg | ORAL_TABLET | Freq: Two times a day (BID) | ORAL | Status: DC

## 2014-12-26 MED ORDER — KETOROLAC TROMETHAMINE 60 MG/2ML IM SOLN
60.0000 mg | Freq: Once | INTRAMUSCULAR | Status: AC
  Administered 2014-12-26: 60 mg via INTRAMUSCULAR
  Filled 2014-12-26: qty 2

## 2014-12-26 MED ORDER — LIDOCAINE 5 % EX PTCH: 1.0000 | MEDICATED_PATCH | CUTANEOUS | Status: DC

## 2014-12-26 NOTE — ED Provider Notes (Signed)
CSN: [id number]     Arrival date & time 12/26/14  1514 History  This chart was scribed for Arthor CaptainAbigail Kaymon Denomme, PA-C working with Rolan BuccoMelanie Belfi, MD by Elveria Risingimelie Horne, ED Scribe. This patient was seen in room WTR5/WTR5 and the patient's care was started at 3:50 PM.   Chief Complaint  Patient presents with  . Back Pain   The history is provided by the patient. No language interpreter was used.   HPI Comments: Rachael Robinson is a 49 y.o. female who presents to the Emergency Department complaining of progressively worsening lower back onset this morning. Patient reports minor aching this morning upon wakening that progressed to spasming, throbbing episodes by midday. Patient reports exacerbated pain with movement during her these episodes. Patient reports treatment with ibuprofen this afternoon, without relief. 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 Patient reports history of similar back spasms but never this intense.  Patient denies fever or urinary symptoms.   History reviewed. No pertinent past medical history. Past Surgical History  Procedure Laterality Date  . Tubal ligation    . Knee surgery     Family History  Problem Relation Age of Onset  . Heart disease Mother   . Diabetes Mother   . Hyperlipidemia Mother   . Kidney disease Mother   . Thyroid disease Mother   . Diabetes Sister   . Stroke Sister   . Heart disease Brother   . Cancer Sister    History  Substance Use Topics  . Smoking status: Current Every Day Smoker -- 0.50 packs/day    Types: Cigarettes  . Smokeless tobacco: Not on file  . Alcohol Use: No   OB History    No data available     Review of Systems  Constitutional: Negative for fever.  Gastrointestinal: Positive for nausea. Negative for vomiting and diarrhea.  Genitourinary: Negative for hematuria and flank pain.  Musculoskeletal: Positive for back pain.    Allergies  Review of patient's allergies indicates no known  allergies.  Home Medications   Prior to Admission medications   Medication Sig Start Date End Date Taking? Authorizing Provider  HYDROcodone-acetaminophen (NORCO/VICODIN) 5-325 MG per tablet Take 1 tablet by mouth every 6 (six) hours as needed for moderate pain. 07/06/14   Fayrene HelperBowie Tran, PA-C  ibuprofen (ADVIL,MOTRIN) 800 MG tablet Take 1 tablet (800 mg total) by mouth every 6 (six) hours as needed for mild pain or moderate pain. 07/06/14   Fayrene HelperBowie Tran, PA-C  methocarbamol (ROBAXIN) 500 MG tablet Take 1 tablet (500 mg total) by mouth 2 (two) times daily. 07/06/14   Fayrene HelperBowie Tran, PA-C   Triage Vitals: BP 129/72 mmHg  Pulse 98  Temp(Src) 98 F (36.7 C) (Oral)  Resp 16  SpO2 99%  LMP 12/08/2014 Physical Exam  Constitutional: She is oriented to person, place, and time. She appears well-developed and well-nourished. No distress.  HENT:  Head: Normocephalic and atraumatic.  Eyes: EOM are normal.  Neck: Neck supple. No tracheal deviation present.  Cardiovascular: Normal rate.   Pulmonary/Chest: Effort normal. No respiratory distress.  Abdominal: There is no CVA tenderness.  Musculoskeletal: Normal range of motion. She exhibits tenderness.  Bilateral lumbar paraspinal spasms and tenderness.  Neurological: She is alert and oriented to person, place, and time.  Skin: Skin is warm and dry.  Psychiatric: She has a normal mood and affect. Her behavior is normal.  Nursing note and vitals reviewed.   ED Course  Procedures (including critical care time)  COORDINATION OF CARE: 3:50 PM- Discussed treatment plan with patient at bedside and patient agreed to plan.   Labs Review Labs Reviewed - No data to display  Imaging Review No results found.   EKG Interpretation None      MDM   Final diagnoses:  Bilateral low back pain without sciatica    I personally performed the services described in this documentation, which was scribed in my presence. The recorded information has been reviewed  and is accurate.   Patient with back pain.  No neurological deficits and normal neuro exam.  Patient can walk but states is painful.  No loss of bowel or bladder control.  No concern for cauda equina.  No fever, night sweats, weight loss, h/o cancer, IVDU.  RICE protocol and pain medicine indicated and discussed with patient.  Patient advised to return for worsening sxs or sxs of UTI.  I personally performed the services described in this documentation, which was scribed in my presence. The recorded information has been reviewed and is accurate.      Arthor CaptainAbigail Tyrisha Benninger, PA-C 12/26/14 1613  Rolan BuccoMelanie Belfi, MD 12/26/14 [phone]

## 2014-12-26 NOTE — ED Notes (Signed)
Pt c/o lower back pain and spasms starting this afternoon.  Pain score 10/10.  Denies injury, numbness, and tingling.  Pt reports taking ibuprofen w/o relief.

## 2014-12-26 NOTE — Discharge Instructions (Signed)
SEEK IMMEDIATE MEDICAL ATTENTION IF: New numbness, tingling, weakness, or problem with the use of your arms or legs.  Severe back pain not relieved with medications.  Change in bowel or bladder control.  Increasing pain in any areas of the body (such as chest or abdominal pain).  Shortness of breath, dizziness or fainting.  Nausea (feeling sick to your stomach), vomiting, fever, or sweats.  Back Injury Prevention Back injuries can be extremely painful and difficult to heal. After having one back injury, you are much more likely to experience another later on. It is important to learn how to avoid injuring or re-injuring your back. The following tips can help you to prevent a back injury. PHYSICAL FITNESS  Exercise regularly and try to develop good tone in your abdominal muscles. Your abdominal muscles provide a lot of the support needed by your back.  Do aerobic exercises (walking, jogging, biking, swimming) regularly.  Do exercises that increase balance and strength (tai chi, yoga) regularly. This can decrease your risk of falling and injuring your back.  Stretch before and after exercising.  Maintain a healthy weight. The more you weigh, the more stress is placed on your back. For every pound of weight, 10 times that amount of pressure is placed on the back. DIET  Talk to your caregiver about how much calcium and vitamin D you need per day. These nutrients help to prevent weakening of the bones (osteoporosis). Osteoporosis can cause broken (fractured) bones that lead to back pain.  Include good sources of calcium in your diet, such as dairy products, green, leafy vegetables, and products with calcium added (fortified).  Include good sources of vitamin D in your diet, such as milk and foods that are fortified with vitamin D.  Consider taking a nutritional supplement or a multivitamin if needed.  Stop smoking if you smoke. POSTURE  Sit and stand up straight. Avoid leaning forward  when you sit or hunching over when you stand.  Choose chairs with good low back (lumbar) support.  If you work at a desk, sit close to your work so you do not need to lean over. Keep your chin tucked in. Keep your neck drawn back and elbows bent at a right angle. Your arms should look like the letter "L."  Sit high and close to the steering wheel when you drive. Add a lumbar support to your car seat if needed.  Avoid sitting or standing in one position for too long. Take breaks to get up, stretch, and walk around at least once every hour. Take breaks if you are driving for long periods of time.  Sleep on your side with your knees slightly bent, or sleep on your back with a pillow under your knees. Do not sleep on your stomach. LIFTING, TWISTING, AND REACHING  Avoid heavy lifting, especially repetitive lifting. If you must do heavy lifting:  Stretch before lifting.  Work slowly.  Rest between lifts.  Use carts and dollies to move objects when possible.  Make several small trips instead of carrying 1 heavy load.  Ask for help when you need it.  Ask for help when moving big, awkward objects.  Follow these steps when lifting:  Stand with your feet shoulder-width apart.  Get as close to the object as you can. Do not try to pick up heavy objects that are far from your body.  Use handles or lifting straps if they are available.  Bend at your knees. Squat down, but keep your heels  off the floor.  Keep your shoulders pulled back, your chin tucked in, and your back straight.  Lift the object slowly, tightening the muscles in your legs, abdomen, and buttocks. Keep the object as close to the center of your body as possible.  When you put a load down, use these same guidelines in reverse.  Do not:  Lift the object above your waist.  Twist at the waist while lifting or carrying a load. Move your feet if you need to turn, not your waist.  Bend over without bending at your  knees.  Avoid reaching over your head, across a table, or for an object on a high surface. OTHER TIPS  Avoid wet floors and keep sidewalks clear of ice to prevent falls.  Do not sleep on a mattress that is too soft or too hard.  Keep items that are used frequently within easy reach.  Put heavier objects on shelves at waist level and lighter objects on lower or higher shelves.  Find ways to decrease your stress, such as exercise, massage, or relaxation techniques. Stress can build up in your muscles. Tense muscles are more vulnerable to injury.  Seek treatment for depression or anxiety if needed. These conditions can increase your risk of developing back pain. SEEK MEDICAL CARE IF:  You injure your back.  You have questions about diet, exercise, or other ways to prevent back injuries. MAKE SURE YOU:  Understand these instructions.  Will watch your condition.  Will get help right away if you are not doing well or get worse. Document Released: 09/17/2004 Document Revised: 11/02/2011 Document Reviewed: 09/21/2011 [facility] Patient Information 2015 [place], [place]. This information is not intended to replace advice given to you by your health care provider. Make sure you discuss any questions you have with your health care provider.  Back Pain, Adult Low back pain is very common. About 1 in 5 people have back pain.The cause of low back pain is rarely dangerous. The pain often gets better over time.About half of people with a sudden onset of back pain feel better in just 2 weeks. About 8 in 10 people feel better by 6 weeks.  CAUSES Some common causes of back pain include:  Strain of the muscles or ligaments supporting the spine.  Wear and tear (degeneration) of the spinal discs.  Arthritis.  Direct injury to the back. DIAGNOSIS Most of the time, the direct cause of low back pain is not known.However, back pain can be treated effectively even when the exact cause of the pain is  unknown.Answering your caregiver's questions about your overall health and symptoms is one of the most accurate ways to make sure the cause of your pain is not dangerous. If your caregiver needs more information, he or she may order lab work or imaging tests (X-rays or MRIs).However, even if imaging tests show changes in your back, this usually does not require surgery. HOME CARE INSTRUCTIONS For many people, back pain returns.Since low back pain is rarely dangerous, it is often a condition that people can learn to [facility] their own.   Remain active. It is stressful on the back to sit or stand in one place. Do not sit, drive, or stand in one place for more than 30 minutes at a time. Take short walks on level surfaces as soon as pain allows.Try to increase the length of time you walk each day.  Do not stay in bed.Resting more than 1 or 2 days can delay your recovery.  Do not avoid exercise or work.Your body is made to move.It is not dangerous to be active, even though your back may hurt.Your back will likely heal faster if you return to being active before your pain is gone.  Pay attention to your body when you bend and lift. Many people have less discomfortwhen lifting if they bend their knees, keep the load close to their bodies,and avoid twisting. Often, the most comfortable positions are those that put less stress on your recovering back.  Find a comfortable position to sleep. Use a firm mattress and lie on your side with your knees slightly bent. If you lie on your back, put a pillow under your knees.  Only take over-the-counter or prescription medicines as directed by your caregiver. Over-the-counter medicines to reduce pain and inflammation are often the most helpful.Your caregiver may prescribe muscle relaxant drugs.These medicines help dull your pain so you can more quickly return to your normal activities and healthy exercise.  Put ice on the injured area.  Put ice in a  plastic bag.  Place a towel between your skin and the bag.  Leave the ice on for 15-20 minutes, 03-04 times a day for the first 2 to 3 days. After that, ice and heat may be alternated to reduce pain and spasms.  Ask your caregiver about trying back exercises and gentle massage. This may be of some benefit.  Avoid feeling anxious or stressed.Stress increases muscle tension and can worsen back pain.It is important to recognize when you are anxious or stressed and learn ways to manage it.Exercise is a great option. SEEK MEDICAL CARE IF:  You have pain that is not relieved with rest or medicine.  You have pain that does not improve in 1 week.  You have new symptoms.  You are generally not feeling well. SEEK IMMEDIATE MEDICAL CARE IF:   You have pain that radiates from your back into your legs.  You develop new bowel or bladder control problems.  You have unusual weakness or numbness in your arms or legs.  You develop nausea or vomiting.  You develop abdominal pain.  You feel faint. Document Released: 08/10/2005 Document Revised: 02/09/2012 Document Reviewed: 12/12/2013 [facility] Patient Information 2015 [place], [place]. This information is not intended to replace advice given to you by your health care provider. Make sure you discuss any questions you have with your health care provider.

## 2015-02-17 ENCOUNTER — Encounter (HOSPITAL_COMMUNITY): Payer: Self-pay | Admitting: Emergency Medicine

## 2015-02-17 ENCOUNTER — Emergency Department (HOSPITAL_COMMUNITY)
Admission: EM | Admit: 2015-02-17 | Discharge: 2015-02-18 | Disposition: A | Discharge status: Home / Self Care | Payer: No Typology Code available for payment source | Attending: Emergency Medicine | Admitting: Emergency Medicine

## 2015-02-17 DIAGNOSIS — R000 Tachycardia, unspecified: Secondary | ICD-10-CM | POA: Insufficient documentation

## 2015-02-17 DIAGNOSIS — R350 Frequency of micturition: Secondary | ICD-10-CM | POA: Diagnosis present

## 2015-02-17 DIAGNOSIS — Z720 Tobacco use: Secondary | ICD-10-CM | POA: Insufficient documentation

## 2015-02-17 DIAGNOSIS — Z79899 Other long term (current) drug therapy: Secondary | ICD-10-CM | POA: Diagnosis not present

## 2015-02-17 DIAGNOSIS — E119 Type 2 diabetes mellitus without complications: Secondary | ICD-10-CM | POA: Insufficient documentation

## 2015-02-17 LAB — CBC WITH DIFFERENTIAL/PLATELET
Basophils Absolute: 0 10*3/uL (ref 0.0–0.1)
Basophils Relative: 0 % (ref 0–1)
Eosinophils Absolute: 0.1 10*3/uL (ref 0.0–0.7)
Eosinophils Relative: 2 % (ref 0–5)
HCT: 43.3 % (ref 36.0–46.0)
Hemoglobin: 14.9 g/dL (ref 12.0–15.0)
Lymphocytes Relative: 26 % (ref 12–46)
Lymphs Abs: 2.1 10*3/uL (ref 0.7–4.0)
MCH: 33.5 pg (ref 26.0–34.0)
MCHC: 34.4 g/dL (ref 30.0–36.0)
MCV: 97.3 fL (ref 78.0–100.0)
Monocytes Absolute: 0.6 10*3/uL (ref 0.1–1.0)
Monocytes Relative: 7 % (ref 3–12)
Neutro Abs: 5.3 10*3/uL (ref 1.7–7.7)
Neutrophils Relative %: 65 % (ref 43–77)
Platelets: 169 10*3/uL (ref 150–400)
RBC: 4.45 MIL/uL (ref 3.87–5.11)
RDW: 12.2 % (ref 11.5–15.5)
WBC: 8.1 10*3/uL (ref 4.0–10.5)

## 2015-02-17 LAB — BASIC METABOLIC PANEL
Anion gap: 9 (ref 5–15)
BUN: 13 mg/dL (ref 6–20)
CO2: 26 mmol/L (ref 22–32)
Calcium: 9.4 mg/dL (ref 8.9–10.3)
Chloride: 100 mmol/L — ABNORMAL LOW (ref 101–111)
Creatinine, Ser: 0.88 mg/dL (ref 0.44–1.00)
GFR calc Af Amer: >60 mL/min (ref >60)
GFR calc non Af Amer: >60 mL/min (ref >60)
Glucose, Bld: 301 mg/dL — ABNORMAL HIGH (ref 65–99)
Potassium: 3.6 mmol/L (ref 3.5–5.1)
Sodium: 135 mmol/L (ref 135–145)

## 2015-02-17 LAB — CBG MONITORING, ED: Glucose-Capillary: 316 mg/dL — ABNORMAL HIGH (ref 65–99)

## 2015-02-17 NOTE — ED Notes (Signed)
Pt ambulating to restroom with steady gait

## 2015-02-17 NOTE — ED Provider Notes (Signed)
CSN: [id number]     Arrival date & time 02/17/15  2034 History   First MD Initiated Contact with Patient 02/17/15 2252     Chief Complaint  Patient presents with  . Urinary Frequency  . Polydipsia     (Consider location/radiation/quality/duration/timing/severity/associated sxs/prior Treatment) Patient is a 49 y.o. female presenting with frequency. The history is provided by the patient.  Urinary Frequency This is a new problem. The current episode started 1 to 4 weeks ago. The problem occurs constantly. The problem has been gradually worsening. Associated symptoms include fatigue. Pertinent negatives include no abdominal pain, chest pain, chills, coughing, diaphoresis, fever, myalgias, nausea or vomiting. Nothing aggravates the symptoms. She has tried drinking for the symptoms. The treatment provided no relief.   [name] presenting to ED with c/o worsening polydipsia and urinary frequency and fatigue for 2 weeks.  Pt states she spoke with her cousin who has diabetes and advised her to have her sugar checked.  Pt does not have a PCP. Pt denies CP, SOB, abdominal pain, fever, chills n/v/d, hematuria or dysuria.   History reviewed. No pertinent past medical history. Past Surgical History  Procedure Laterality Date  . Tubal ligation    . Knee surgery     Family History  Problem Relation Age of Onset  . Heart disease Mother   . Diabetes Mother   . Hyperlipidemia Mother   . Kidney disease Mother   . Thyroid disease Mother   . Diabetes Sister   . Stroke Sister   . Heart disease Brother   . Cancer Sister    History  Substance Use Topics  . Smoking status: Current Every Day Smoker -- 0.50 packs/day    Types: Cigarettes  . Smokeless tobacco: Not on file  . Alcohol Use: No   OB History    No data available     Review of Systems  Constitutional: Positive for fatigue. Negative for fever, chills, diaphoresis and appetite change.  Respiratory: Negative for cough and  shortness of breath.   Cardiovascular: Negative for chest pain, palpitations and leg swelling.  Gastrointestinal: Negative for nausea, vomiting, abdominal pain and diarrhea.  Endocrine: Positive for polydipsia and polyuria. Negative for polyphagia.  Genitourinary: Positive for frequency. Negative for dysuria, urgency, hematuria, flank pain and pelvic pain.  Musculoskeletal: Negative for myalgias and back pain.  All other systems reviewed and are negative.     Allergies  Review of patient's allergies indicates no known allergies.  Home Medications   Prior to Admission medications   Medication Sig Start Date End Date Taking? Authorizing Provider  Homeopathic Products (AZO YEAST PLUS) TABS Take 1 tablet by mouth once.   Yes Historical Provider, MD  cyclobenzaprine (FLEXERIL) 10 MG tablet Take 1 tablet (10 mg total) by mouth 2 (two) times daily as needed for muscle spasms. Patient not taking: Reported on 02/17/2015 12/26/14   Arthor Captain, PA-C  lidocaine (LIDODERM) 5 % Place 1 patch onto the skin daily. Remove & Discard patch within 12 hours or as directed by MD Patient not taking: Reported on 02/17/2015 12/26/14   Arthor Captain, PA-C  metFORMIN (GLUCOPHAGE) 500 MG tablet Take 1 tablet (500 mg total) by mouth 2 (two) times daily with a meal. 02/18/15   Junius Finner, PA-C  naproxen (NAPROSYN) 500 MG tablet Take 1 tablet (500 mg total) by mouth 2 (two) times daily with a meal. Patient not taking: Reported on 02/17/2015 12/26/14   Arthor Captain, PA-C  traMADol Janean Sark) 50  MG tablet Take 1-2 tablets (50-100 mg total) by mouth every 6 (six) hours as needed for severe pain. Patient not taking: Reported on 02/17/2015 12/26/14   Arthor Captain, PA-C   BP 145/91 mmHg  Pulse 106  Temp(Src) 98.2 F (36.8 C) (Oral)  Resp 20  SpO2 97% Physical Exam  Constitutional: She is oriented to person, place, and time. She appears well-developed and well-nourished. No distress.  HENT:  Head: Normocephalic and  atraumatic.  Eyes: Conjunctivae are normal. No scleral icterus.  Neck: Normal range of motion. Neck supple.  Cardiovascular: Normal rate, regular rhythm and normal heart sounds.   Tachycardic in triage but not on exam.  Pulmonary/Chest: Effort normal and breath sounds normal. No respiratory distress. She has no wheezes. She has no rales. She exhibits no tenderness.  Abdominal: Soft. Bowel sounds are normal. She exhibits no distension and no mass. There is no tenderness. There is no rebound and no guarding.  Musculoskeletal: Normal range of motion.  Neurological: She is alert and oriented to person, place, and time.  Skin: Skin is warm and dry. She is not diaphoretic.  Nursing note and vitals reviewed.   ED Course  Procedures (including critical care time) Labs Review Labs Reviewed  URINALYSIS, ROUTINE W REFLEX MICROSCOPIC (NOT AT [facility]) - Abnormal; Notable for the following:    APPearance CLOUDY (*)    Specific Gravity, Urine 1.041 (*)    Glucose, UA >1000 (*)    Hgb urine dipstick TRACE (*)    Leukocytes, UA SMALL (*)    All other components within normal limits  BASIC METABOLIC PANEL - Abnormal; Notable for the following:    Chloride 100 (*)    Glucose, Bld 301 (*)    All other components within normal limits  URINE MICROSCOPIC-ADD ON - Abnormal; Notable for the following:    Squamous Epithelial / LPF FEW (*)    All other components within normal limits  CBG MONITORING, ED - Abnormal; Notable for the following:    Glucose-Capillary 316 (*)    All other components within normal limits  CBC WITH DIFFERENTIAL/PLATELET    Imaging Review No results found.   EKG Interpretation None      MDM   Final diagnoses:  Newly diagnosed diabetes   [name] presenting to ED with concern for diabetes as she has had worsening fatigue, polyuria and polydipsia for 2 weeks. She does not have a PCP.  Labs significant for CBG of 316, glucose in urine without ketones. No evidence  of DKA.   Will start pt on metformin. Pt is safe for discharge home. Provided pt education about DM.  Strongly advised pt to establish care with PCP at [facility] or [facility] for continued monitoring and management of her diabetes. Return precautions provided. Pt verbalized understanding and agreement with tx plan.    Junius Finner, PA-C 02/18/15 1308  Toy Cookey, MD 02/19/15 [phone]

## 2015-02-17 NOTE — ED Notes (Signed)
Attempted to call patient, no answer 

## 2015-02-17 NOTE — ED Notes (Addendum)
Pt states that she has had frequent urination x [redacted] weeks along with excessive thirst. CBG 316. Alert and oriented.

## 2015-02-18 LAB — URINALYSIS, ROUTINE W REFLEX MICROSCOPIC
Bilirubin Urine: NEGATIVE
Glucose, UA: >1000 mg/dL — AB
KETONES UR: NEGATIVE mg/dL
Nitrite: NEGATIVE
Protein, ur: NEGATIVE mg/dL
SPECIFIC GRAVITY, URINE: 1.041 — AB (ref 1.005–1.030)
Urobilinogen, UA: 0.2 mg/dL (ref 0.0–1.0)
pH: 5.5 (ref 5.0–8.0)

## 2015-02-18 LAB — URINE MICROSCOPIC-ADD ON

## 2015-02-18 MED ORDER — METFORMIN HCL 500 MG PO TABS: 500.0000 mg | ORAL_TABLET | Freq: Two times a day (BID) | ORAL | Status: DC

## 2015-02-18 NOTE — ED Notes (Signed)
Pt alert, oriented,and ambulatory upon DC. She was advised to fill metformin and follow up with PCP as soon as possible. Diabetes teaching provided by this RN.

## 2015-02-28 ENCOUNTER — Other Ambulatory Visit: Payer: Self-pay | Admitting: Internal Medicine

## 2015-02-28 DIAGNOSIS — Z1231 Encounter for screening mammogram for malignant neoplasm of breast: Secondary | ICD-10-CM

## 2015-03-06 ENCOUNTER — Ambulatory Visit
Admission: RE | Admit: 2015-03-06 | Discharge: 2015-03-06 | Disposition: A | Discharge status: Home / Self Care | Payer: No Typology Code available for payment source | Source: Ambulatory Visit | Attending: Internal Medicine | Admitting: Internal Medicine

## 2015-03-06 DIAGNOSIS — Z1231 Encounter for screening mammogram for malignant neoplasm of breast: Secondary | ICD-10-CM

## 2015-10-29 ENCOUNTER — Encounter (HOSPITAL_COMMUNITY): Payer: Self-pay | Admitting: Emergency Medicine

## 2015-10-29 ENCOUNTER — Emergency Department (HOSPITAL_COMMUNITY)
Admission: EM | Admit: 2015-10-29 | Discharge: 2015-10-29 | Disposition: A | Discharge status: Home / Self Care | Payer: No Typology Code available for payment source | Attending: Emergency Medicine | Admitting: Emergency Medicine

## 2015-10-29 ENCOUNTER — Emergency Department (HOSPITAL_COMMUNITY): Payer: No Typology Code available for payment source

## 2015-10-29 DIAGNOSIS — F17210 Nicotine dependence, cigarettes, uncomplicated: Secondary | ICD-10-CM | POA: Insufficient documentation

## 2015-10-29 DIAGNOSIS — Z7984 Long term (current) use of oral hypoglycemic drugs: Secondary | ICD-10-CM | POA: Insufficient documentation

## 2015-10-29 DIAGNOSIS — J069 Acute upper respiratory infection, unspecified: Secondary | ICD-10-CM | POA: Insufficient documentation

## 2015-10-29 DIAGNOSIS — E119 Type 2 diabetes mellitus without complications: Secondary | ICD-10-CM | POA: Insufficient documentation

## 2015-10-29 HISTORY — DX: Type 2 diabetes mellitus without complications: E11.9

## 2015-10-29 LAB — RAPID STREP SCREEN (MED CTR MEBANE ONLY): Streptococcus, Group A Screen (Direct): NEGATIVE

## 2015-10-29 MED ORDER — ACETAMINOPHEN 325 MG PO TABS
650.0000 mg | ORAL_TABLET | Freq: Once | ORAL | Status: AC
  Administered 2015-10-29: 650 mg via ORAL
  Filled 2015-10-29: qty 2

## 2015-10-29 NOTE — Discharge Instructions (Signed)
Upper Respiratory Infection, Adult Most upper respiratory infections (URIs) are a viral infection of the air passages leading to the lungs. A URI affects the nose, throat, and upper air passages. The most common type of URI is nasopharyngitis and is typically referred to as "the common cold." URIs run their course and usually go away on their own. Most of the time, a URI does not require medical attention, but sometimes a bacterial infection in the upper airways can follow a viral infection. This is called a secondary infection. Sinus and middle ear infections are common types of secondary upper respiratory infections. Bacterial pneumonia can also complicate a URI. A URI can worsen asthma and chronic obstructive pulmonary disease (COPD). Sometimes, these complications can require emergency medical care and may be life threatening.  CAUSES Almost all URIs are caused by viruses. A virus is a type of germ and can spread from one person to another.  RISKS FACTORS You may be at risk for a URI if:   You smoke.   You have chronic heart or lung disease.  You have a weakened defense (immune) system.   You are very young or very old.   You have nasal allergies or asthma.  You work in crowded or poorly ventilated areas.  You work in health care facilities or schools. SIGNS AND SYMPTOMS  Symptoms typically develop 2-3 days after you come in contact with a cold virus. Most viral URIs last 7-10 days. However, viral URIs from the influenza virus (flu virus) can last 14-18 days and are typically more severe. Symptoms may include:   Runny or stuffy (congested) nose.   Sneezing.   Cough.   Sore throat.   Headache.   Fatigue.   Fever.   Loss of appetite.   Pain in your forehead, behind your eyes, and over your cheekbones (sinus pain).  Muscle aches.  DIAGNOSIS  Your health care provider may diagnose a URI by:  Physical exam.  Tests to check that your symptoms are not due to  another condition such as:  Strep throat.  Sinusitis.  Pneumonia.  Asthma. TREATMENT  A URI goes away on its own with time. It cannot be cured with medicines, but medicines may be prescribed or recommended to relieve symptoms. Medicines may help:  Reduce your fever.  Reduce your cough.  Relieve nasal congestion. HOME CARE INSTRUCTIONS   Take medicines only as directed by your health care provider.   Gargle warm saltwater or take cough drops to comfort your throat as directed by your health care provider.  Use a warm mist humidifier or inhale steam from a shower to increase air moisture. This may make it easier to breathe.  Drink enough fluid to keep your urine clear or pale yellow.   Eat soups and other clear broths and maintain good nutrition.   Rest as needed.   Return to work when your temperature has returned to normal or as your health care provider advises. You may need to stay home longer to avoid infecting others. You can also use a face mask and careful hand washing to prevent spread of the virus.  Increase the usage of your inhaler if you have asthma.   Do not use any tobacco products, including cigarettes, chewing tobacco, or electronic cigarettes. If you need help quitting, ask your health care provider. PREVENTION  The best way to protect yourself from getting a cold is to practice good hygiene.   Avoid oral or hand contact with people with cold   symptoms.   Wash your hands often if contact occurs.  There is no clear evidence that vitamin C, vitamin E, echinacea, or exercise reduces the chance of developing a cold. However, it is always recommended to get plenty of rest, exercise, and practice good nutrition.  SEEK MEDICAL CARE IF:   You are getting worse rather than better.   Your symptoms are not controlled by medicine.   You have chills.  You have worsening shortness of breath.  You have brown or red mucus.  You have yellow or brown nasal  discharge.  You have pain in your face, especially when you bend forward.  You have a fever.  You have swollen neck glands.  You have pain while swallowing.  You have white areas in the back of your throat. SEEK IMMEDIATE MEDICAL CARE IF:   You have severe or persistent:  Headache.  Ear pain.  Sinus pain.  Chest pain.  You have chronic lung disease and any of the following:  Wheezing.  Prolonged cough.  Coughing up blood.  A change in your usual mucus.  You have a stiff neck.  You have changes in your:  Vision.  Hearing.  Thinking.  Mood. MAKE SURE YOU:   Understand these instructions.  Will watch your condition.  Will get help right away if you are not doing well or get worse.   This information is not intended to replace advice given to you by your health care provider. Make sure you discuss any questions you have with your health care provider.   Document Released: 02/03/2001 Document Revised: 12/25/2014 Document Reviewed: 11/15/2013 Elsevier Interactive Patient Education 2016 [employer]  

## 2015-10-29 NOTE — ED Provider Notes (Signed)
CSN: [id number]     Arrival date & time 10/29/15  1129 History   By signing my name below, I, Rachael Robinson, attest that this documentation has been prepared under the direction and in the presence of Rachael Odonell PA-C.  Electronically Signed: Arlan Robinson, ED Scribe. 10/29/2015. 1:27 PM.   Chief Complaint  Patient presents with  . Fever  . Fatigue  . Sore Throat   The history is provided by the patient. No language interpreter was used.    HPI Comments: Rachael Robinson is a 50 y.o. female with a PMHx of DM who presents to the Emergency Department complaining of constant, ongoing nasal congestion onset this morning. Endorses purulent nasal discharge. Pt also reports generalized body aches, productive cough consisting of green/yellow mucous, sore throat, fatigue, and sinus pressure. Her sore throat increases with swallowing. Denies difficulty handling secretions or breathing. She describes her sinus pressure as something pushing out behind her eyes. OTC sinus and allergy attempted at home without any improvement. Pt denies any fevers, HA, ear pain, eye redness, eye discharge, neck pain, SOB, abdomina pain, nausea, or vomiting. No known sick contacts. 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 No other complaints today.   PCP: No PCP Per Patient    Past Medical History  Diagnosis Date  . Diabetes mellitus without complication [facility])    Past Surgical History  Procedure Laterality Date  . Tubal ligation    . Knee surgery     Family History  Problem Relation Age of Onset  . Heart disease Mother   . Diabetes Mother   . Hyperlipidemia Mother   . Kidney disease Mother   . Thyroid disease Mother   . Diabetes Sister   . Stroke Sister   . Heart disease Brother   . Cancer Sister    Social History  Substance Use Topics  . Smoking status: Current Every Day Smoker -- 0.50 packs/day    Types: Cigarettes  . Smokeless tobacco: None  . Alcohol Use: No   OB History    No data available      Review of Systems  Constitutional: Positive for fatigue. Negative for fever and chills.  HENT: Positive for congestion, sinus pressure and sore throat.   Respiratory: Positive for cough.   Musculoskeletal: Positive for myalgias.  All other systems reviewed and are negative.     Allergies  Review of patient's allergies indicates no known allergies.  Home Medications   Prior to Admission medications   Medication Sig Start Date End Date Taking? Authorizing Provider  cyclobenzaprine (FLEXERIL) 10 MG tablet Take 1 tablet (10 mg total) by mouth 2 (two) times daily as needed for muscle spasms. Patient not taking: Reported on 02/17/2015 12/26/14   Arthor Captain, PA-C  Homeopathic Products (AZO YEAST PLUS) TABS Take 1 tablet by mouth once.    Historical Provider, MD  lidocaine (LIDODERM) 5 % Place 1 patch onto the skin daily. Remove & Discard patch within 12 hours or as directed by MD Patient not taking: Reported on 02/17/2015 12/26/14   Arthor Captain, PA-C  metFORMIN (GLUCOPHAGE) 500 MG tablet Take 1 tablet (500 mg total) by mouth 2 (two) times daily with a meal. 02/18/15   Junius Finner, PA-C  naproxen (NAPROSYN) 500 MG tablet Take 1 tablet (500 mg total) by mouth 2 (two) times daily with a meal. Patient not taking: Reported on 02/17/2015 12/26/14   Arthor Captain, PA-C  traMADol (ULTRAM) 50 MG tablet Take 1-2 tablets (50-100 mg total) by  mouth every 6 (six) hours as needed for severe pain. Patient not taking: Reported on 02/17/2015 12/26/14   Arthor CaptainAbigail Harris, PA-C   Triage Vitals: BP 128/85 mmHg  Pulse 81  Temp(Src) 98.6 F (37 C) (Oral)  Resp 15  Ht 5\' 6"  (1.676 m)  Wt 110.315 kg  BMI 39.27 kg/m2  SpO2 98%  LMP 10/09/2015   Physical Exam  Constitutional: She appears well-developed and well-nourished. No distress.  HENT:  Head: Normocephalic and atraumatic.  Right Ear: Tympanic membrane and ear canal normal.  Left Ear: Tympanic membrane and ear canal normal.  Nose: Nose normal.  Right sinus exhibits no maxillary sinus tenderness and no frontal sinus tenderness. Left sinus exhibits no maxillary sinus tenderness and no frontal sinus tenderness.  Mouth/Throat: Uvula is midline. Mucous membranes are not dry. No uvula swelling. Posterior oropharyngeal erythema present. No oropharyngeal exudate, posterior oropharyngeal edema or tonsillar abscesses.  Eyes: Conjunctivae are normal. Right eye exhibits no discharge. Left eye exhibits no discharge. No scleral icterus.  Neck: Normal range of motion. Neck supple.  Cardiovascular: Normal rate, regular rhythm and normal heart sounds.   Pulmonary/Chest: Effort normal and breath sounds normal. No respiratory distress.  Musculoskeletal: Normal range of motion.  Lymphadenopathy:    She has no cervical adenopathy.  Neurological: She is alert. Coordination normal.  Skin: Skin is warm and dry.  Psychiatric: She has a normal mood and affect. Her behavior is normal.  Nursing note and vitals reviewed.   ED Course  Procedures (including critical care time)  DIAGNOSTIC STUDIES: Oxygen Saturation is 98% on RA, Normal by my interpretation.    COORDINATION OF CARE: 1:22 PM- Will order rapid strep screen and CXR. Discussed treatment plan with pt at bedside and pt agreed to plan.     Labs Review Labs Reviewed  [facility]APID STRE[facility]EN [facility])  CULTURE, GROUP A STREP [facility])    Imaging Review Dg Chest 2 View  10/29/2015  CLINICAL DATA:  50 year old with fatigue, general body aches, cough and sore throat today. Smoker with history of diabetes. EXAM: CHEST  2 VIEW COMPARISON:  Radiographs 05/24/2013 and 03/18/2011. FINDINGS: Lower lung volumes. The heart size and mediastinal contours are stable. There is mild central airway thickening, but no airspace disease, edema, pleural effusion or hyperinflation. Scattered osteophytes are present within the spine. IMPRESSION: Central airway thickening suggesting bronchitis. No evidence of pneumonia or  edema. Electronically Signed   By: Rachael BullocksWilliam  Robinson M.D.   On: 10/29/2015 14:15   I have personally reviewed and evaluated these images and lab results as part of my medical decision-making.   EKG Interpretation None      MDM   Final diagnoses:  URI (upper respiratory infection)   Patient presenting with fatigue, myalgias, sore throat, cough and congestion x 1 day. VSS. Pt is nontoxic appearing. No nasal musosal edema noted. TMs pearly gray without erythema or effusion. Oropharynx erythematous without edema or exudate. Lungs CTAB. CXR negative for acute infiltrate. Negative rapid strep. Patients symptoms are consistent with URI, likely viral etiology. Discussed that antibiotics are not indicated for viral infections. Pt will be discharged with symptomatic treatment. Verbalizes understanding and is agreeable with plan. Pt is hemodynamically stable & in NAD prior to dc.  I personally performed the services described in this documentation, which was scribed in my presence. The recorded information has been reviewed and is accurate.   Rolm GalaStevi Blu Mcglaun, PA-C 10/29/15 1458  Rolland PorterMark James, MD 11/05/15 1650

## 2015-10-29 NOTE — ED Notes (Signed)
Pt states that she has had fatigue, gen body aches, cough, and sore throat since this morning

## 2015-11-01 LAB — CULTURE, GROUP A STREP (THRC)

## 2016-10-28 ENCOUNTER — Emergency Department (HOSPITAL_COMMUNITY)
Admission: EM | Admit: 2016-10-28 | Discharge: 2016-10-28 | Disposition: A | Discharge status: Home / Self Care | Payer: Self-pay | Attending: Emergency Medicine | Admitting: Emergency Medicine

## 2016-10-28 ENCOUNTER — Emergency Department (HOSPITAL_COMMUNITY): Payer: Self-pay

## 2016-10-28 ENCOUNTER — Encounter (HOSPITAL_COMMUNITY): Payer: Self-pay

## 2016-10-28 DIAGNOSIS — M79604 Pain in right leg: Secondary | ICD-10-CM | POA: Insufficient documentation

## 2016-10-28 DIAGNOSIS — E119 Type 2 diabetes mellitus without complications: Secondary | ICD-10-CM | POA: Insufficient documentation

## 2016-10-28 DIAGNOSIS — Z7984 Long term (current) use of oral hypoglycemic drugs: Secondary | ICD-10-CM | POA: Insufficient documentation

## 2016-10-28 DIAGNOSIS — F17210 Nicotine dependence, cigarettes, uncomplicated: Secondary | ICD-10-CM | POA: Insufficient documentation

## 2016-10-28 MED ORDER — KETOROLAC TROMETHAMINE 30 MG/ML IJ SOLN
15.0000 mg | Freq: Once | INTRAMUSCULAR | Status: AC
  Administered 2016-10-28: 15 mg via INTRAMUSCULAR
  Filled 2016-10-28: qty 1

## 2016-10-28 MED ORDER — TRAMADOL HCL 50 MG PO TABS: 50.0000 mg | ORAL_TABLET | Freq: Four times a day (QID) | ORAL | 0 refills | Status: DC | PRN

## 2016-10-28 MED ORDER — PREDNISONE 20 MG PO TABS
60.0000 mg | ORAL_TABLET | ORAL | Status: AC
  Administered 2016-10-28: 60 mg via ORAL
  Filled 2016-10-28: qty 3

## 2016-10-28 MED ORDER — PREDNISONE 20 MG PO TABS: 40.0000 mg | ORAL_TABLET | Freq: Every day | ORAL | 0 refills | Status: DC

## 2016-10-28 MED ORDER — LORAZEPAM 2 MG/ML IJ SOLN
1.0000 mg | Freq: Once | INTRAMUSCULAR | Status: AC
  Administered 2016-10-28: 1 mg via INTRAMUSCULAR
  Filled 2016-10-28: qty 1

## 2016-10-28 MED FILL — traMADol HCL 50 MG TABS: 50 | 4 days supply | Qty: 15 | Fill #0

## 2016-10-28 MED FILL — predniSONE 20 MG TABS: 20 | 4 days supply | Qty: 8 | Fill #0

## 2016-10-28 NOTE — Discharge Instructions (Signed)
As discussed, with her ongoing back and leg pain and is very important that he follow up with our orthopedic colleagues.  Return here for concerning changes in your condition.

## 2016-10-28 NOTE — ED Provider Notes (Signed)
[facility]-EMERGENCY DEPT Provider Note   CSN: [id number] Arrival date & time: 10/28/16  0802     History   Chief Complaint Chief Complaint  Patient presents with  . Leg Pain    HPI Rachael Robinson is a 51 y.o. female.  HPI  Patient presents with maternal right thigh pain. She notes over the past days, possibly week she has had ongoing pain in her lower back and proximal legs, bilaterally. However, today, after standing suddenly, the patient felt some exacerbation of pain through the right thigh, from the lateral inferior hip inferiorly to the anterior lateral knee. Pain is sharp, severe, worse with any attempted motion. No improvement with Tylenol. Patient has no history of back surgery, hip surgery. No fall. No distal loss of sensation or weakness. No other weakness, no other pain, no other complaints.   Past Medical History:  Diagnosis Date  . Diabetes mellitus without complication (HCC)     There are no active problems to display for this patient.   Past Surgical History:  Procedure Laterality Date  . KNEE SURGERY    . TUBAL LIGATION      OB History    No data available       Home Medications    Prior to Admission medications   Medication Sig Start Date End Date Taking? Authorizing Provider  cyclobenzaprine (FLEXERIL) 10 MG tablet Take 1 tablet (10 mg total) by mouth 2 (two) times daily as needed for muscle spasms. Patient not taking: Reported on 02/17/2015 12/26/14   Arthor CaptainAbigail Harris, PA-C  Homeopathic Products (AZO YEAST PLUS) TABS Take 1 tablet by mouth once.    Historical Provider, MD  lidocaine (LIDODERM) 5 % Place 1 patch onto the skin daily. Remove & Discard patch within 12 hours or as directed by MD Patient not taking: Reported on 02/17/2015 12/26/14   Arthor CaptainAbigail Harris, PA-C  metFORMIN (GLUCOPHAGE) 500 MG tablet Take 1 tablet (500 mg total) by mouth 2 (two) times daily with a meal. 02/18/15   Junius FinnerErin O'Malley, PA-C  naproxen (NAPROSYN) 500 MG tablet Take 1  tablet (500 mg total) by mouth 2 (two) times daily with a meal. Patient not taking: Reported on 02/17/2015 12/26/14   Arthor CaptainAbigail Harris, PA-C  traMADol (ULTRAM) 50 MG tablet Take 1-2 tablets (50-100 mg total) by mouth every 6 (six) hours as needed for severe pain. Patient not taking: Reported on 02/17/2015 12/26/14   Arthor CaptainAbigail Harris, PA-C    Family History Family History  Problem Relation Age of Onset  . Heart disease Mother   . Diabetes Mother   . Hyperlipidemia Mother   . Kidney disease Mother   . Thyroid disease Mother   . Diabetes Sister   . Stroke Sister   . Heart disease Brother   . Cancer Sister     Social History Social History  Substance Use Topics  . Smoking status: Current Every Day Smoker    Packs/day: 0.50    Types: Cigarettes  . Smokeless tobacco: Never Used  . Alcohol use No     Allergies   Patient has no known allergies.   Review of Systems Review of Systems  Constitutional:       Per HPI, otherwise negative  HENT:       Per HPI, otherwise negative  Respiratory:       Per HPI, otherwise negative  Cardiovascular:       Per HPI, otherwise negative  Gastrointestinal: Negative for vomiting.  Endocrine:  Negative aside from HPI  Genitourinary:       Neg aside from HPI   Musculoskeletal:       Per HPI, otherwise negative  Skin: Negative for color change.  Allergic/Immunologic: Negative for immunocompromised state.  Neurological: Negative for syncope.     Physical Exam Updated Vital Signs BP 141/81 (BP Location: Right Arm)   Pulse 90   Temp 97.6 F (36.4 C) (Oral)   Resp 20   Ht 5\' 7"  (1.702 m)   Wt 240 lb (108.9 kg)   LMP 10/14/2016   SpO2 98%   BMI 37.59 kg/m   Physical Exam  Constitutional: She is oriented to person, place, and time. She appears well-developed and well-nourished.  Uncomfortable appearing obese F awake, alert.  HENT:  Head: Normocephalic and atraumatic.  Eyes: Conjunctivae and EOM are normal.  Cardiovascular: Normal  rate and regular rhythm.   Pulmonary/Chest: Effort normal and breath sounds normal. No stridor. No respiratory distress.  Abdominal: She exhibits no distension.  Musculoskeletal: She exhibits no edema.  Pain can bear weight, but motion of the R LE results in pain in the lateral inferior hip radiating to the R lateral/anterior knee.  Quad function intact, though w pain w knee extension.  Minimal pain w knee flexion.  Neurological: She is alert and oriented to person, place, and time. No cranial nerve deficit.  No appreciable strength or sensory loss in R LE.  Skin: Skin is warm and dry.  Psychiatric: She has a normal mood and affect.  Nursing note and vitals reviewed.    ED Treatments / Results   Radiology Dg Hip Unilat W Or Wo Pelvis 2-3 Views Right  Result Date: 10/28/2016 CLINICAL DATA:  Pt has had hip and leg pain bil x 2 weeks. Yesterday felt a "charlie horse" in rt calf. Went to stand and felt a pop in her rt hip. Now ltd weightbearing to rt leg. EXAM: DG HIP (WITH OR WITHOUT PELVIS) 2-3V RIGHT COMPARISON:  CT 05/14/2010 FINDINGS: Mild narrowing of the articular cartilage right worse than left. Small marginal spurs from the right femoral head and acetabulum. No fracture or dislocation. IMPRESSION: 1. Bilateral hip osteoarthritis, right worse than left. Electronically Signed   By: Corlis Leak M.D.   On: 10/28/2016 09:03    Procedures Procedures (including critical care time)  Medications Ordered in ED Medications  LORazepam (ATIVAN) injection 1 mg (not administered)  ketorolac (TORADOL) 30 MG/ML injection 15 mg (not administered)     Initial Impression / Assessment and Plan / ED Course  I have reviewed the triage vital signs and the nursing notes.  Pertinent labs & imaging results that were available during my care of the patient were reviewed by me and considered in my medical decision making (see chart for details).  Patient presents with ongoing low back and right leg pain,  worse over the past days. Here the patient is awake and alert, distally neurovascularly intact. No asymmetry suggesting DVT, no pulmonary complaints suggesting a PE. Given the patient's description of sharp onset of pain, there is some suspicion for a musculoskeletal etiology, though osteoarthritis is another consideration. Patient can ambulate, can bear weight, though with some discomfort. With no fall, no evidence for fracture. Patient discharged to follow-up with orthopedics.  Final Clinical Impressions(s) / ED Diagnoses   Final diagnoses:  Right leg pain    New Prescriptions New Prescriptions   PREDNISONE (DELTASONE) 20 MG TABLET    Take 2 tablets (40 mg total) by  mouth daily with breakfast. For the next four days   TRAMADOL (ULTRAM) 50 MG TABLET    Take 1 tablet (50 mg total) by mouth every 6 (six) hours as needed.     Gerhard Munch, MD 10/28/16 1023

## 2016-10-28 NOTE — ED Triage Notes (Signed)
Pt has had leg pain bil x 2 weeks.  Yesterday felt a "charlie horse" in rt calf.  Went to stand and felt a pop in her rt hip.  Now unable to bear weight to rt leg.

## 2017-10-03 ENCOUNTER — Other Ambulatory Visit: Payer: Self-pay

## 2017-10-03 ENCOUNTER — Emergency Department (HOSPITAL_COMMUNITY)
Admission: EM | Admit: 2017-10-03 | Discharge: 2017-10-03 | Disposition: A | Discharge status: Home / Self Care | Payer: Self-pay | Attending: Emergency Medicine | Admitting: Emergency Medicine

## 2017-10-03 ENCOUNTER — Emergency Department (HOSPITAL_COMMUNITY): Payer: Self-pay

## 2017-10-03 ENCOUNTER — Encounter (HOSPITAL_COMMUNITY): Payer: Self-pay | Admitting: *Deleted

## 2017-10-03 DIAGNOSIS — M25551 Pain in right hip: Secondary | ICD-10-CM

## 2017-10-03 DIAGNOSIS — M1611 Unilateral primary osteoarthritis, right hip: Secondary | ICD-10-CM | POA: Insufficient documentation

## 2017-10-03 DIAGNOSIS — G8929 Other chronic pain: Secondary | ICD-10-CM | POA: Insufficient documentation

## 2017-10-03 MED ORDER — KETOROLAC TROMETHAMINE 60 MG/2ML IM SOLN
60.0000 mg | Freq: Once | INTRAMUSCULAR | Status: AC
  Administered 2017-10-03: 60 mg via INTRAMUSCULAR
  Filled 2017-10-03: qty 2

## 2017-10-03 MED ORDER — OXYCODONE-ACETAMINOPHEN 5-325 MG PO TABS
1.0000 | ORAL_TABLET | Freq: Once | ORAL | Status: AC
Start: 2017-10-03 — End: 2017-10-03
  Administered 2017-10-03: 1 via ORAL
  Filled 2017-10-03: qty 1

## 2017-10-03 MED ORDER — HYDROCODONE-ACETAMINOPHEN 5-325 MG PO TABS: 1.0000 | ORAL_TABLET | ORAL | 0 refills | Status: DC | PRN

## 2017-10-03 NOTE — ED Triage Notes (Signed)
Pt stated "My right hip has been hurting x 2 wks.  Have been told I had arthritis in it."

## 2017-10-03 NOTE — ED Provider Notes (Signed)
[facility]-EMERGENCY DEPT Provider Note   CSN: [id number] Arrival date & time: 10/03/17  0636     History   Chief Complaint Chief Complaint  Patient presents with  . Hip Pain    right    HPI Rachael Robinson is a 52 y.o. female.  HPI Patient is a 52 year old female with a long-standing history of recurrent right hip pain.  She presents with worsening right hip pain over the past 2 weeks.  It is worse with ambulation.  No recent injury or fall.  No fevers or chills.  No history of gout.  She was seen last year for similar symptoms and was found to have arthritis of her right hip.  She has not followed up with an orthopedic surgeon.  Pain is moderate in severity.  No pain with passive range of motion.  Significant pain with weightbearing.   Past Medical History:  Diagnosis Date  . Diabetes mellitus without complication (HCC)     There are no active problems to display for this patient.   Past Surgical History:  Procedure Laterality Date  . KNEE SURGERY    . TUBAL LIGATION      OB History    No data available       Home Medications    Prior to Admission medications   Medication Sig Start Date End Date Taking? Authorizing Provider  acetaminophen (TYLENOL) 500 MG tablet Take 1,000 mg by mouth every 6 (six) hours as needed for mild pain, moderate pain, fever or headache.    [provider]  HYDROcodone-acetaminophen (NORCO/VICODIN) 5-325 MG tablet Take 1 tablet by mouth every 4 (four) hours as needed for moderate pain. 10/03/17   Azalia Bilis, MD  metFORMIN (GLUCOPHAGE) 1000 MG tablet Take 500 mg by mouth 2 (two) times daily with a meal.    [provider]  metFORMIN (GLUCOPHAGE) 500 MG tablet Take 1 tablet (500 mg total) by mouth 2 (two) times daily with a meal. Patient not taking: Reported on 10/28/2016 02/18/15   Lurene Shadow, PA-C  predniSONE (DELTASONE) 20 MG tablet Take 2 tablets (40 mg total) by mouth daily with breakfast.  For the next four days 10/28/16   Gerhard Munch, MD  traMADol (ULTRAM) 50 MG tablet Take 1 tablet (50 mg total) by mouth every 6 (six) hours as needed. 10/28/16   Gerhard Munch, MD    Family History Family History  Problem Relation Age of Onset  . Heart disease Mother   . Diabetes Mother   . Hyperlipidemia Mother   . Kidney disease Mother   . Thyroid disease Mother   . Diabetes Sister   . Stroke Sister   . Heart disease Brother   . Cancer Sister     Social History Social History   Tobacco Use  . Smoking status: Current Every Day Smoker    Packs/day: 0.50    Types: Cigarettes  . Smokeless tobacco: Never Used  Substance Use Topics  . Alcohol use: No  . Drug use: No     Allergies   Patient has no known allergies.   Review of Systems Review of Systems  All other systems reviewed and are negative.    Physical Exam Updated Vital Signs BP 140/90 (BP Location: Right Arm)   Pulse 76   Temp 97.9 F (36.6 C) (Oral)   Resp 20   Ht 5\' 6"  (1.676 m)   Wt 118.8 kg (262 lb)   LMP 08/01/2017 (Approximate) Comment: preg test  waiver signed  SpO2 96%   BMI 42.29 kg/m   Physical Exam  Constitutional: She is oriented to person, place, and time. She appears well-developed and well-nourished.  HENT:  Head: Normocephalic.  Eyes: EOM are normal.  Neck: Normal range of motion.  Pulmonary/Chest: Effort normal.  Abdominal: She exhibits no distension.  Musculoskeletal: Normal range of motion.  No swelling of the right lower extremity as compared to left.  Normal passive range of motion of the right hip.  No noted swelling or erythema of the right hip.  Normal range of motion of the right ankle and right knee.  No lumbar tenderness  Neurological: She is alert and oriented to person, place, and time.  Psychiatric: She has a normal mood and affect.  Nursing note and vitals reviewed.    ED Treatments / Results  Labs (all labs ordered are listed, but only abnormal results are  displayed) Labs Reviewed - No data to display  EKG  EKG Interpretation None       Radiology Dg Hip Unilat W Or Wo Pelvis 2-3 Views Right  Result Date: 10/03/2017 CLINICAL DATA:  52 year old female with chronic right hip pain for 1 year. EXAM: DG HIP (WITH OR WITHOUT PELVIS) 2-3V RIGHT COMPARISON:  10/28/2016 radiographs FINDINGS: Moderate degenerative changes in the right hip noted. Mild degenerative changes in the left hip are present. There is no evidence of acute fracture or dislocation. No suspicious focal bony lesions are present. IMPRESSION: Degenerative changes in both hips, moderate on the right and mild on the left. Electronically Signed   By: Harmon PierJeffrey  Hu M.D.   On: 10/03/2017 08:27    Procedures Procedures (including critical care time)  Medications Ordered in ED Medications  oxyCODONE-acetaminophen (PERCOCET/ROXICET) 5-325 MG per tablet 1 tablet (1 tablet Oral Given 10/03/17 0751)  ketorolac (TORADOL) injection 60 mg (60 mg Intramuscular Given 10/03/17 0752)     Initial Impression / Assessment and Plan / ED Course  I have reviewed the triage vital signs and the nursing notes.  Pertinent labs & imaging results that were available during my care of the patient were reviewed by me and considered in my medical decision making (see chart for details).     Arthritic flare of her right hip.  Home with anti-inflammatories and short course of pain medication.  Outpatient orthopedic follow-up.  She may benefit from steroid injections versus hip replacement.  I recommended weight loss and low impact exercise  Final Clinical Impressions(s) / ED Diagnoses   Final diagnoses:  Acute pain of right hip  Arthritis of right hip    ED Discharge Orders        Ordered    HYDROcodone-acetaminophen (NORCO/VICODIN) 5-325 MG tablet  Every 4 hours PRN     10/03/17 0858       Azalia Bilisampos, Jauan Wohl, MD 10/03/17 0901

## 2017-10-14 DIAGNOSIS — M25551 Pain in right hip: Secondary | ICD-10-CM | POA: Insufficient documentation

## 2018-01-15 ENCOUNTER — Emergency Department (HOSPITAL_COMMUNITY)
Admission: EM | Admit: 2018-01-15 | Discharge: 2018-01-15 | Disposition: A | Discharge status: Home / Self Care | Payer: No Typology Code available for payment source | Attending: Emergency Medicine | Admitting: Emergency Medicine

## 2018-01-15 ENCOUNTER — Other Ambulatory Visit: Payer: Self-pay

## 2018-01-15 ENCOUNTER — Encounter (HOSPITAL_COMMUNITY): Payer: Self-pay | Admitting: Emergency Medicine

## 2018-01-15 DIAGNOSIS — F17210 Nicotine dependence, cigarettes, uncomplicated: Secondary | ICD-10-CM | POA: Insufficient documentation

## 2018-01-15 DIAGNOSIS — J029 Acute pharyngitis, unspecified: Secondary | ICD-10-CM | POA: Insufficient documentation

## 2018-01-15 DIAGNOSIS — E119 Type 2 diabetes mellitus without complications: Secondary | ICD-10-CM | POA: Insufficient documentation

## 2018-01-15 LAB — GROUP A STREP BY PCR: Group A Strep by PCR: NOT DETECTED

## 2018-01-15 MED ORDER — DEXAMETHASONE 1 MG/ML PO CONC
10.0000 mg | Freq: Once | ORAL | Status: AC
  Administered 2018-01-15: 10 mg via ORAL
  Filled 2018-01-15: qty 10

## 2018-01-15 MED ORDER — BENZONATATE 100 MG PO CAPS: 100.0000 mg | ORAL_CAPSULE | Freq: Three times a day (TID) | ORAL | 0 refills | Status: DC

## 2018-01-15 NOTE — ED Triage Notes (Signed)
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

## 2018-01-15 NOTE — ED Provider Notes (Signed)
[facility]-EMERGENCY DEPT Provider Note   CSN: [id number] Arrival date & time: 01/15/18  4098     History   Chief Complaint Chief Complaint  Patient presents with  . Sore Throat    HPI Rachael Robinson is a 52 y.o. female.  HPI Rachael Robinson is a 52 y.o. female presents to emergency department with complaint of sore throat and cough.  Patient states that she developed sore throat yesterday.  She reports is painful to swallow.  She is able to swallow.  She reports hoarseness to her voice.  She reports dry nonproductive cough. 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 No treatment prior to coming in. No other complaints.   Past Medical History:  Diagnosis Date  . Diabetes mellitus without complication (HCC)     There are no active problems to display for this patient.   Past Surgical History:  Procedure Laterality Date  . KNEE SURGERY    . TUBAL LIGATION       OB History   None      Home Medications    Prior to Admission medications   Medication Sig Start Date End Date Taking? Authorizing Provider  HYDROcodone-acetaminophen (NORCO/VICODIN) 5-325 MG tablet Take 1 tablet by mouth every 4 (four) hours as needed for moderate pain. 10/03/17   Azalia Bilis, MD  ibuprofen (ADVIL,MOTRIN) 200 MG tablet Take 400 mg by mouth every 6 (six) hours as needed for fever or moderate pain.    [provider]    Family History Family History  Problem Relation Age of Onset  . Heart disease Mother   . Diabetes Mother   . Hyperlipidemia Mother   . Kidney disease Mother   . Thyroid disease Mother   . Diabetes Sister   . Stroke Sister   . Heart disease Brother   . Cancer Sister     Social History Social History   Tobacco Use  . Smoking status: Current Every Day Smoker    Packs/day: 0.50    Types: Cigarettes  . Smokeless tobacco: Never Used  Substance Use Topics  . Alcohol use: No  . Drug use: No      Allergies   Patient has no known allergies.   Review of Systems Review of Systems  Constitutional: Negative for chills and fever.  HENT: Positive for sore throat. Negative for congestion.   Respiratory: Positive for cough. Negative for chest tightness and shortness of breath.   Cardiovascular: Negative for chest pain, palpitations and leg swelling.  Gastrointestinal: Negative for abdominal pain, diarrhea, nausea and vomiting.  Genitourinary: Negative for dysuria, flank pain, pelvic pain, vaginal bleeding, vaginal discharge and vaginal pain.  Musculoskeletal: Negative for arthralgias, myalgias, neck pain and neck stiffness.  Skin: Negative for rash.  Neurological: Negative for dizziness, weakness and headaches.  All other systems reviewed and are negative.    Physical Exam Updated Vital Signs BP (!) 167/100 (BP Location: Left Arm)   Pulse 87   Temp 98.2 F (36.8 C) (Oral)   Resp 18   Ht  (1.702 m)   SpO2 99%   BMI 41.04 kg/m   Physical Exam  Constitutional: She is oriented to person, place, and time. She appears well-developed and well-nourished. No distress.  HENT:  Head: Normocephalic.  Oropharynx is erythematous.  Tonsils are normal, no exudate.  Uvula midline.  Eyes: Conjunctivae are normal.  Neck: Neck supple.  Cardiovascular: Normal rate, regular rhythm and normal heart  sounds.  Pulmonary/Chest: Effort normal and breath sounds normal. No respiratory distress. She has no wheezes. She has no rales.  Abdominal: Soft. Bowel sounds are normal. She exhibits no distension. There is no tenderness. There is no rebound.  Musculoskeletal: She exhibits no edema.  Neurological: She is alert and oriented to person, place, and time.  Skin: Skin is warm and dry.  Psychiatric: She has a normal mood and affect. Her behavior is normal.  Nursing note and vitals reviewed.    ED Treatments / Results  Labs (all labs ordered are listed, but only abnormal results are  displayed) Labs Reviewed  GROUP A STREP BY PCR    EKG None  Radiology No results found.  Procedures Procedures (including critical care time)  Medications Ordered in ED Medications - No data to display   Initial Impression / Assessment and Plan / ED Course  I have reviewed the triage vital signs and the nursing notes.  Pertinent labs & imaging results that were available during my care of the patient were reviewed by me and considered in my medical decision making (see chart for details).     Patient with sore throat since yesterday, concerned she may have strep given recent exposure.  She is afebrile, oropharynx is erythematous, however no exudate, no tonsillar enlargement, uvula is midline.  No concern for peritonsillar or retropharyngeal abscess.  She is tolerating secretions.  Strep by PCR is negative.  I will give her a Decadron dose in the ED, otherwise discharged home with symptomatic treatment.  This most likely viral gastroenteritis.  Advised to take Tylenol or Motrin for pain.  Salt water gargles.  She is also requesting some medicine for her cough.  I will give her prescription for Tessalon.  Robitussin over-the-counter.  Return precautions discussed.  Vitals:   01/15/18 0639 01/15/18 0640  BP: (!) 167/100   Pulse: 87   Resp: 18   Temp: 98.2 F (36.8 C)   TempSrc: Oral   SpO2: 99%   Height:   (1.702 m)     Final Clinical Impressions(s) / ED Diagnoses   Final diagnoses:  Viral pharyngitis    ED Discharge Orders    None       Jaynie Crumble, PA-C 01/15/18 7829    Doug Sou, MD 01/15/18 [phone]

## 2018-01-15 NOTE — Discharge Instructions (Addendum)
Take Tylenol or Motrin for pain.  Salt water gargles several times a day.  Drink plenty of fluids.  Take Robitussin over-the-counter for cough.  Tessalon for cough as needed as well.  Follow-up with family doctor next week or return if worsening

## 2019-12-19 DIAGNOSIS — M1611 Unilateral primary osteoarthritis, right hip: Secondary | ICD-10-CM | POA: Insufficient documentation

## 2019-12-27 ENCOUNTER — Encounter: Payer: Self-pay | Admitting: Gastroenterology

## 2020-01-08 ENCOUNTER — Other Ambulatory Visit: Payer: Self-pay

## 2020-01-08 ENCOUNTER — Ambulatory Visit (AMBULATORY_SURGERY_CENTER): Payer: Self-pay | Admitting: *Deleted

## 2020-01-08 VITALS — Temp 96.8°F | Ht 67.0 in | Wt 244.0 lb

## 2020-01-08 DIAGNOSIS — Z800 Family history of malignant neoplasm of digestive organs: Secondary | ICD-10-CM

## 2020-01-08 MED ORDER — NA SULFATE-K SULFATE-MG SULF 17.5-3.13-1.6 GM/177ML PO SOLN: ORAL | 0 refills | Status: DC

## 2020-01-08 NOTE — Progress Notes (Signed)
Patient is here in-person for PV. Patient denies any allergies to eggs or soy. Patient denies any problems with anesthesia/sedation. Patient denies any oxygen use at home. Patient denies taking any diet/weight loss medications or blood thinners. Patient is not being treated for MRSA or C-diff. Patient is aware of our care-partner policy and Covid-19 safety protocol. EMMI education assisgned to the patient for the procedure, this was explained and instructions given to patient.  Completed covid vaccines  April 2021 per pt.  Prep Prescription coupon given to the patient.

## 2020-01-23 ENCOUNTER — Encounter: Payer: Self-pay | Admitting: Gastroenterology

## 2020-03-12 ENCOUNTER — Encounter: Payer: Self-pay | Admitting: Gastroenterology

## 2020-04-30 ENCOUNTER — Encounter: Payer: Self-pay | Admitting: Gastroenterology

## 2020-05-15 ENCOUNTER — Encounter: Payer: Self-pay | Admitting: Gastroenterology

## 2020-06-11 ENCOUNTER — Telehealth: Payer: Self-pay | Admitting: *Deleted

## 2020-06-11 NOTE — Telephone Encounter (Signed)
Missed pre-visit this morning @8 :30.  Left message to call and reschedule before 5 pm today to avoid cancellation of up-coming colonoscopy 06/26/2020.

## 2020-06-26 ENCOUNTER — Encounter: Payer: Self-pay | Admitting: Gastroenterology

## 2020-08-08 ENCOUNTER — Encounter: Payer: Self-pay | Admitting: Family Medicine

## 2020-08-21 ENCOUNTER — Encounter: Payer: Self-pay | Admitting: Gastroenterology

## 2021-04-21 ENCOUNTER — Emergency Department (HOSPITAL_COMMUNITY)
Admission: EM | Admit: 2021-04-21 | Discharge: 2021-04-21 | Disposition: A | Discharge status: Home / Self Care | Payer: 59 | Attending: Emergency Medicine | Admitting: Emergency Medicine

## 2021-04-21 ENCOUNTER — Other Ambulatory Visit: Payer: Self-pay

## 2021-04-21 ENCOUNTER — Emergency Department (HOSPITAL_COMMUNITY): Payer: 59

## 2021-04-21 ENCOUNTER — Encounter (HOSPITAL_COMMUNITY): Payer: Self-pay

## 2021-04-21 DIAGNOSIS — F17210 Nicotine dependence, cigarettes, uncomplicated: Secondary | ICD-10-CM | POA: Diagnosis not present

## 2021-04-21 DIAGNOSIS — E119 Type 2 diabetes mellitus without complications: Secondary | ICD-10-CM | POA: Insufficient documentation

## 2021-04-21 DIAGNOSIS — R0602 Shortness of breath: Secondary | ICD-10-CM | POA: Diagnosis not present

## 2021-04-21 DIAGNOSIS — R0789 Other chest pain: Secondary | ICD-10-CM | POA: Diagnosis present

## 2021-04-21 LAB — COMPREHENSIVE METABOLIC PANEL
ALT: 29 U/L (ref 0–44)
AST: 25 U/L (ref 15–41)
Albumin: 4.2 g/dL (ref 3.5–5.0)
Alkaline Phosphatase: 62 U/L (ref 38–126)
Anion gap: 9 (ref 5–15)
BUN: 21 mg/dL — ABNORMAL HIGH (ref 6–20)
CO2: 24 mmol/L (ref 22–32)
Calcium: 10.2 mg/dL (ref 8.9–10.3)
Chloride: 106 mmol/L (ref 98–111)
Creatinine, Ser: 0.9 mg/dL (ref 0.44–1.00)
GFR, Estimated: >60 mL/min (ref >60)
Glucose, Bld: 216 mg/dL — ABNORMAL HIGH (ref 70–99)
Potassium: 4.1 mmol/L (ref 3.5–5.1)
Sodium: 139 mmol/L (ref 135–145)
Total Bilirubin: 0.6 mg/dL (ref 0.3–1.2)
Total Protein: 7.5 g/dL (ref 6.5–8.1)

## 2021-04-21 LAB — D-DIMER, QUANTITATIVE: D-Dimer, Quant: 1.6 ug/mL-FEU — ABNORMAL HIGH (ref 0.00–0.50)

## 2021-04-21 LAB — CBC WITH DIFFERENTIAL/PLATELET
Abs Immature Granulocytes: 0.01 10*3/uL (ref 0.00–0.07)
Basophils Absolute: 0 10*3/uL (ref 0.0–0.1)
Basophils Relative: 0 %
Eosinophils Absolute: 0 10*3/uL (ref 0.0–0.5)
Eosinophils Relative: 1 %
HCT: 46 % (ref 36.0–46.0)
Hemoglobin: 15.4 g/dL — ABNORMAL HIGH (ref 12.0–15.0)
Immature Granulocytes: 0 %
Lymphocytes Relative: 19 %
Lymphs Abs: 1.2 10*3/uL (ref 0.7–4.0)
MCH: 33.2 pg (ref 26.0–34.0)
MCHC: 33.5 g/dL (ref 30.0–36.0)
MCV: 99.1 fL (ref 80.0–100.0)
Monocytes Absolute: 0.4 10*3/uL (ref 0.1–1.0)
Monocytes Relative: 7 %
Neutro Abs: 4.6 10*3/uL (ref 1.7–7.7)
Neutrophils Relative %: 73 %
Platelets: 167 10*3/uL (ref 150–400)
RBC: 4.64 MIL/uL (ref 3.87–5.11)
RDW: 11.9 % (ref 11.5–15.5)
WBC: 6.3 10*3/uL (ref 4.0–10.5)
nRBC: 0 % (ref 0.0–0.2)

## 2021-04-21 LAB — TROPONIN I (HIGH SENSITIVITY)
Troponin I (High Sensitivity): 4 ng/L (ref <18)
Troponin I (High Sensitivity): 5 ng/L (ref <18)

## 2021-04-21 MED ORDER — SODIUM CHLORIDE 0.9 % IV BOLUS
1000.0000 mL | Freq: Once | INTRAVENOUS | Status: AC
  Administered 2021-04-21: 1000 mL via INTRAVENOUS

## 2021-04-21 MED ORDER — IOHEXOL 350 MG/ML SOLN
80.0000 mL | Freq: Once | INTRAVENOUS | Status: AC | PRN
  Administered 2021-04-21: 80 mL via INTRAVENOUS

## 2021-04-21 NOTE — Discharge Instructions (Addendum)
Your laboratory results were within normal limits. You will need strict follow up with cardiology.  I have placed a referral for this along with you will need to follow-up and call the office to make an appointment.  You will need to begin taking a baby aspirin daily until you see cardiology.

## 2021-04-21 NOTE — ED Provider Notes (Signed)
[facility]   CSN: [id number] Arrival date & time: 04/21/21  6378     History Chief Complaint  Patient presents with   Chest Pain    Rachael Robinson is a 55 y.o. female.  55 year old female with a past medical history of hypertension, diabetes, high triglycerides presents to the ED with a chief complaint of chest pain x this morning.  Patient reports she was standing in her house, getting ready for work when she suddenly began to feel some left-sided pressure pain to her left chest without any radiation.  Reports this pain was waxing and waning in intensity.  Similar episode occurred when she was cooking breakfast this morning, when she also felt some shortness of breath occurring at the moment.  She reports taking her pain medication for her arthritis with some improvement of symptoms.  She reports being driven here by her boss, symptoms have resolved at this time.  He does have a family history of CAD, with her brother having an MI at the age of 70.  She also endorses daily smoking, "not even a pack a day ".  Does not have any prior history of CAD or CHF.  No prior history of blood clots.  The history is provided by the patient and medical records.  Chest Pain Pain location:  L chest Pain quality: pressure   Pain radiates to:  Does not radiate Onset quality:  Sudden Duration:  3 hours Timing:  Constant Progression:  Resolved Chronicity:  New Relieved by:  Nothing Worsened by:  Nothing Ineffective treatments:  None tried Associated symptoms: shortness of breath   Associated symptoms: no anxiety, no cough, no fever and no headache       Past Medical History:  Diagnosis Date   Arthritis    right hip   Diabetes mellitus without complication [facility])     Patient Active Problem List   Diagnosis Date Noted   Arthritis of right hip 12/19/2019   Pain in joint of right hip 10/14/2017    Past Surgical History:  Procedure  Laterality Date   KNEE SURGERY  [phone]   x2   TUBAL LIGATION  1991     OB History   No obstetric history on file.     Family History  Problem Relation Age of Onset   Heart disease Mother    Diabetes Mother    Hyperlipidemia Mother    Kidney disease Mother    Thyroid disease Mother    Diabetes Sister    Stroke Sister    Heart disease Brother    Colon cancer Brother 58   Cancer Sister    Breast cancer Sister    Colon polyps Neg Hx    Esophageal cancer Neg Hx    Rectal cancer Neg Hx    Stomach cancer Neg Hx     Social History   Tobacco Use   Smoking status: Every Day    Packs/day: 0.50    Types: Cigarettes   Smokeless tobacco: Never  Vaping Use   Vaping Use: Never used  Substance Use Topics   Alcohol use: No   Drug use: No    Home Medications Prior to Admission medications   Medication Sig Start Date End Date Taking? Authorizing Provider  acetaminophen (TYLENOL 8 HOUR) 650 MG CR tablet Take 650 mg by mouth every 8 (eight) hours as needed for pain.    [provider]  Na Sulfate-K Sulfate-Mg Sulf 17.5-3.13-1.6 GM/177ML SOLN Suprep (  no substitutions)-TAKE AS DIRECTED. 01/08/20   Armbruster, Willaim Rayas, MD  ofloxacin (OCUFLOX) 0.3 % ophthalmic solution  01/05/20   [provider]    Allergies    Patient has no known allergies.  Review of Systems   Review of Systems  Constitutional:  Negative for fever.  Respiratory:  Positive for shortness of breath. Negative for cough.   Cardiovascular:  Positive for chest pain.  Neurological:  Negative for headaches.  All other systems reviewed and are negative.  Physical Exam Updated Vital Signs BP (!) 168/101   Pulse 71   Temp 97.6 F (36.4 C) (Oral)   Resp 20   Ht 5\' 7"  (1.702 m)   Wt 110 kg   SpO2 97%   BMI 37.98 kg/m   Physical Exam Vitals and nursing note reviewed.  Constitutional:      Appearance: She is well-developed. She is not ill-appearing.  HENT:     Head: Normocephalic and  atraumatic.  Cardiovascular:     Rate and Rhythm: Normal rate.     Heart sounds: Normal heart sounds. No murmur heard. Pulmonary:     Effort: Pulmonary effort is normal.     Breath sounds: No decreased breath sounds or wheezing.  Neurological:     Mental Status: She is alert.    ED Results / Procedures / Treatments   Labs (all labs ordered are listed, but only abnormal results are displayed) Labs Reviewed  CBC WITH DIFFERENTIAL/PLATELET - Abnormal; Notable for the following components:      Result Value   Hemoglobin 15.4 (*)    All other components within normal limits  COMPREHENSIVE METABOLIC PANEL - Abnormal; Notable for the following components:   Glucose, Bld 216 (*)    BUN 21 (*)    All other components within normal limits  D-DIMER, QUANTITATIVE - Abnormal; Notable for the following components:   D-Dimer, Quant 1.60 (*)    All other components within normal limits  TROPONIN I (HIGH SENSITIVITY)  TROPONIN I (HIGH SENSITIVITY)    EKG None  Radiology DG Chest 2 View  Result Date: 04/21/2021 CLINICAL DATA:  Chest pain and shortness of breath EXAM: CHEST - 2 VIEW COMPARISON:  10/29/2015 FINDINGS: The heart size and mediastinal contours are within normal limits. Both lungs are clear. The visualized skeletal structures are unremarkable. IMPRESSION: No active cardiopulmonary disease. Electronically Signed   By: 12/29/2015 M.D.   On: 04/21/2021 09:34   CT Angio Chest PE W and/or Wo Contrast  Result Date: 04/21/2021 CLINICAL DATA:  Chest pain for several hours EXAM: CT ANGIOGRAPHY CHEST WITH CONTRAST TECHNIQUE: Multidetector CT imaging of the chest was performed using the standard protocol during bolus administration of intravenous contrast. Multiplanar CT image reconstructions and MIPs were obtained to evaluate the vascular anatomy. CONTRAST:  7mL OMNIPAQUE IOHEXOL 350 MG/ML SOLN COMPARISON:  Chest x-ray from earlier in the same day. FINDINGS: Cardiovascular: Thoracic aorta  demonstrates atherosclerotic calcifications. No aneurysmal dilatation is seen. The degree of opacification is limited for evaluation of possible dissection. Pulmonary artery is well visualized with a normal branching pattern. No intraluminal filling defect is seen to suggest pulmonary embolism. Mediastinum/Nodes: Thoracic inlet is within normal limits. No sizable hilar or mediastinal adenopathy is noted. The esophagus as visualized is within normal limits. Lungs/Pleura: Lungs are well aerated bilaterally. No focal infiltrate or sizable effusion is seen. No parenchymal nodules are noted. Upper Abdomen: Visualized upper abdomen appears within normal limits. Musculoskeletal: Degenerative changes of the thoracic spine are noted.  Review of the MIP images confirms the above findings. IMPRESSION: No evidence of pulmonary emboli. No acute abnormality noted. Aortic Atherosclerosis (ICD10-I70.0). Electronically Signed   By: Alcide Clever M.D.   On: 04/21/2021 12:57    Procedures Procedures   Medications Ordered in ED Medications  sodium chloride 0.9 % bolus 1,000 mL (1,000 mLs Intravenous New Bag/Given 04/21/21 1227)  iohexol (OMNIPAQUE) 350 MG/ML injection 80 mL (80 mLs Intravenous Contrast Given 04/21/21 1230)    ED Course  I have reviewed the triage vital signs and the nursing notes.  Pertinent labs & imaging results that were available during my care of the patient were reviewed by me and considered in my medical decision making (see chart for details).    MDM Rules/Calculators/A&P     Patient here with a past medical history of diabetes, smoking, hypertension, high triglycerides presents to the ED with a chief complaint of sudden onset of left-sided chest pressure that began this morning, symptoms have resolved at this time without any intervention.  Reports no similar history of this.  No prior history of CAD.  No prior cardiology follow-up.  Does have a relative brother who had an MI at the age of  17.  She arrived in the ED with stable vital signs, no hypoxia, has not had any fevers.  Blood pressure is elevated but does report taking her medication.  I have a lower suspicion for infectious at this time with out any fevers, coughs or URI symptoms.  She does have high risk for ACS.  No prior cardiology work-up is on record.  Will obtain troponin.  EKG without any ST changes, no changes consistent with ischemia.  Interpretation of her blood improving CBC with no leukocytosis, hemoglobin slightly increased, suspect hemoconcentrated from tobacco history.  CMP without any electrolyte derangement, creatinine levels within normal limits.  LFTs are unremarkable.  First troponin is flat at 4.  D-dimer was obtained, due to sudden onset of chest pain along with shortness of breath and lungs and history of smoking with a slight elevated heart rate on arrival.  This was positive on today's visit.  HEART SCORE 4  Results were discussed with patient, we also discussed obtaining a CT angio to rule out any pulmonary embolism at this time.  He was provided with fluids, she will be receiving IV contrast.  She remains hemodynamically stable.  We will also obtain delta troponin at this time.  Delta troponin is 5 at this time.  CT angio did not show any pulmonary embolism.  These results were discussed with patient, she remains asymptomatic at this time.  She is ambulatory in the ED with a steady gait.  We discussed appropriate follow-up with cardiology, strict return precautions were provided at length.  Patient is stable for discharge.   Portions of this note were generated with Scientist, clinical (histocompatibility and immunogenetics). Dictation errors may occur despite best attempts at proofreading.  Final Clinical Impression(s) / ED Diagnoses Final diagnoses:  Atypical chest pain    Rx / DC Orders ED Discharge Orders          Ordered    Ambulatory referral to Cardiology        04/21/21 1309             Claude Manges,  Cordelia Poche 04/21/21 1328    Linwood Dibbles, MD 04/22/21 1446

## 2021-04-21 NOTE — ED Triage Notes (Addendum)
Pt states 4hr PTA patient started having substernal chest pain radiating through to her back when getting ready for work with [employer]. Chest pain is resolved at this time

## 2021-06-09 ENCOUNTER — Ambulatory Visit: Payer: 59 | Admitting: Internal Medicine

## 2021-06-24 DIAGNOSIS — R072 Precordial pain: Secondary | ICD-10-CM | POA: Insufficient documentation

## 2021-06-24 DIAGNOSIS — I700 Atherosclerosis of aorta: Secondary | ICD-10-CM | POA: Insufficient documentation

## 2021-06-24 NOTE — Progress Notes (Deleted)
Cardiology Office Note   Date:  06/24/2021   ID:  Rachael Robinson, DOB [birth year], MRN [id number]  PCP:  Irena Reichmann, DO  Cardiologist:   None Referring:  ***  No chief complaint on file.     History of Present Illness: Rachael Robinson is a 55 y.o. female who presents for ***    The patient was in the ED for chest pain in August.  I reviewed these records for this visit.   Troponin was negative.  D dimer was elevated.  There was no PE on CT.  There was evidence of aortic atherosclerosis.   ***    Past Medical History:  Diagnosis Date   Arthritis    right hip   Diabetes mellitus without complication [facility])     Past Surgical History:  Procedure Laterality Date   KNEE SURGERY  [phone]   x2   TUBAL LIGATION  1991     Current Outpatient Medications  Medication Sig Dispense Refill   acetaminophen (TYLENOL 8 HOUR) 650 MG CR tablet Take 650 mg by mouth every 8 (eight) hours as needed for pain.     Na Sulfate-K Sulfate-Mg Sulf 17.5-3.13-1.6 GM/177ML SOLN Suprep (no substitutions)-TAKE AS DIRECTED. 354 mL 0   ofloxacin (OCUFLOX) 0.3 % ophthalmic solution      No current facility-administered medications for this visit.    Allergies:   Patient has no known allergies.    Social History:  The patient  reports that she has been smoking cigarettes. She has been smoking an average of .5 packs per day. She has never used smokeless tobacco. She reports that she does not drink alcohol and does not use drugs.   Family History:  The patient's ***family history includes Breast cancer in her sister; Cancer in her sister; Colon cancer (age of onset: 17) in her brother; Diabetes in her mother and sister; Heart disease in her brother and mother; Hyperlipidemia in her mother; Kidney disease in her mother; Stroke in her sister; Thyroid disease in her mother.    ROS:  Please see the history of present illness.   Otherwise, review of systems are positive for {NONE DEFAULTED:18576}.    All other systems are reviewed and negative.    PHYSICAL EXAM: VS:  There were no vitals taken for this visit. , BMI There is no height or weight on file to calculate BMI. GENERAL:  Well appearing HEENT:  Pupils equal round and reactive, fundi not visualized, oral mucosa unremarkable NECK:  No jugular venous distention, waveform within normal limits, carotid upstroke brisk and symmetric, no bruits, no thyromegaly LYMPHATICS:  No cervical, inguinal adenopathy LUNGS:  Clear to auscultation bilaterally BACK:  No CVA tenderness CHEST:  Unremarkable HEART:  PMI not displaced or sustained,S1 and S2 within normal limits, no S3, no S4, no clicks, no rubs, *** murmurs ABD:  Flat, positive bowel sounds normal in frequency in pitch, no bruits, no rebound, no guarding, no midline pulsatile mass, no hepatomegaly, no splenomegaly EXT:  2 plus pulses throughout, no edema, no cyanosis no clubbing SKIN:  No rashes no nodules NEURO:  Cranial nerves II through XII grossly intact, motor grossly intact throughout PSYCH:  Cognitively intact, oriented to person place and time    EKG:  EKG {ACTION; IS/IS [id number]} ordered today. The ekg ordered today demonstrates ***   Recent Labs: 04/21/2021: ALT 29; BUN 21; Creatinine, Ser 0.90; Hemoglobin 15.4; Platelets 167; Potassium 4.1; Sodium 139    Lipid Panel No results found  for: CHOL, TRIG, HDL, CHOLHDL, VLDL, LDLCALC, LDLDIRECT    Wt Readings from Last 3 Encounters:  04/21/21 242 lb 8.1 oz (110 kg)  01/08/20 244 lb (110.7 kg)  10/03/17 262 lb (118.8 kg)      Other studies Reviewed: Additional studies/ records that were reviewed today include: ***. Review of the above records demonstrates:  Please see elsewhere in the note.  ***   ASSESSMENT AND PLAN:  CHEST PAIN: ***  AORTIC ATHEROSCLEROSIS:  ***   Current medicines are reviewed at length with the patient today.  The patient {ACTIONS; HAS/DOES NOT HAVE:19233} concerns regarding  medicines.  The following changes have been made:  {PLAN; NO CHANGE:13088:s}  Labs/ tests ordered today include: *** No orders of the defined types were placed in this encounter.    Disposition:   FU with ***    Signed, Rollene Rotunda, MD  06/24/2021 11:31 AM    [facility]

## 2021-06-25 ENCOUNTER — Ambulatory Visit: Payer: 59 | Admitting: Cardiology

## 2021-06-25 DIAGNOSIS — I700 Atherosclerosis of aorta: Secondary | ICD-10-CM

## 2021-06-25 DIAGNOSIS — R072 Precordial pain: Secondary | ICD-10-CM

## 2021-07-22 NOTE — Progress Notes (Signed)
Cardiology Office Note:    Date:  07/24/2021   ID:  Rachael Robinson, DOB 09/26/1965, MRN [id number]  PCP:  Irena Reichmann, DO  Cardiologist:  None  Electrophysiologist:  None   Referring MD: Claude Manges, PA-C   Chief Complaint/Reason for Referral: Chest pain  History of Present Illness:     Rachael Robinson is a 55 y.o. female with the indicated history here for recommendations regarding chest pain.  The patient was seen in the emergency department in August.  She was cleaning her house and felt left-sided chest pressure without radiation.  It happened earlier that day as well when she was cooking breakfast.  She was also short of breath.  She presented to the emergency department where her laboratories were unremarkable including negative troponins.  Her D-dimer was elevated and a PE protocol CT scan was performed.  I reviewed the study.  It showed no CT and no coronary calcification.  There was very mild aortic calcification seen.  Her EKG demonstrated sinus tachycardia with no acute ischemic changes.  She was ultimately discharged from the hospital.  She tells me that today she was seen in the emergency department was a day quite stressful for her.  She was worrying about her bills and other things at home.  She tells me she does never had chest pain before or after this.  She is able to do all of her activities of daily living without any signs or symptoms of angina.  She has had no significant shortness of breath, presyncope, syncope, palpitations, paroxysmal nocturnal dyspnea.  When she exerts herself more than moderately she will get mild shortness of breath.  She is otherwise well without significant complaints.  Past Medical History:  Diagnosis Date   Arthritis    right hip   Chest pain    Diabetes mellitus without complication [facility])     Past Surgical History:  Procedure Laterality Date   KNEE SURGERY  [phone]   x2   TUBAL LIGATION  1991    Current  Medications: Current Meds  Medication Sig   acetaminophen (TYLENOL) 650 MG CR tablet Take 650 mg by mouth every 8 (eight) hours as needed for pain.   ASPIRIN 81 PO Take by mouth.   glipiZIDE (GLUCOTROL XL) 2.5 MG 24 hr tablet Take 2.5 mg by mouth every morning.     Allergies:   No Known Allergies  Social History   Tobacco Use   Smoking status: Every Day    Packs/day: 0.50    Types: Cigarettes   Smokeless tobacco: Never  Vaping Use   Vaping Use: Never used  Substance Use Topics   Alcohol use: No   Drug use: No     Family History: Family History  Problem Relation Age of Onset   Heart disease Mother    Diabetes Mother    Hyperlipidemia Mother    Kidney disease Mother    Thyroid disease Mother    Diabetes Sister    Stroke Sister    Heart disease Brother    Colon cancer Brother 25   Cancer Sister    Breast cancer Sister    Colon polyps Neg Hx    Esophageal cancer Neg Hx    Rectal cancer Neg Hx    Stomach cancer Neg Hx      ROS:   Please see the history of present illness.    All other systems reviewed and are negative.  EKGs/Labs/Other Studies Reviewed:    The following  studies were reviewed today:  EKG:  SR without Q waves or ST changes  Imaging studies that I have independently reviewed today: EKG  Recent Labs: 04/21/2021: ALT 29; BUN 21; Creatinine, Ser 0.90; Hemoglobin 15.4; Platelets 167; Potassium 4.1; Sodium 139  Recent Lipid Panel No results found for: CHOL, TRIG, HDL, CHOLHDL, VLDL, LDLCALC, LDLDIRECT  [facility] labs Comprehensive Metabolic Panel (CMP)   2021-06-25    Sodium 141   135-145  Potassium 4.3   3.5-5.3  Chloride 103   97-108  CO2 26   22-32  Glucose 115   65-99  BUN 14   6-20  Creatinine 0.71   0.50-1.00  Calcium 9.2   8.6-10.4  Protein 6.3   6.0-8.3  Albumin 4.2   3.5-5.3  Alkaline Phosphatase 75   35-121  ALT (SGPT) 22   <5-47  AST (SGOT) 12   <5-40  Bilirubin, Total 0.3   <0.2-1.2  A/G Ratio 2.0   1.1-2.5  Estimated GFR (Black)  111   >59  Estimated GFR (Other) 95   >59  1,5 Anhydroglucitol (GlycoMark)   2021-06-25    1,5 Anhydroglucitol (GlycoMark) 11.9   6.8-29.3  Hemoglobin A1C   2021-06-25    Hemoglobin A1C 7.1   <5.7  Estimated Average Glucose 157      Lipid Panel   2021-06-25    Cholesterol 157   <200  Triglycerides 156   <150  HDL Cholesterol 40   >39  Cholesterol / HDL Ratio 3.92   0.00-4.44  Non-HDL Cholesterol 117   <130  LDL Cholesterol (Calculation) 86   <130  LDL/HDL Ratio 2.1   <3.3    Physical Exam:    VS:  BP (!) 146/98 (BP Location: Left Arm, Patient Position: Sitting, Cuff Size: Large)   Pulse 96   Ht 5\' 7"  (1.702 m)   Wt 263 lb 12.8 oz (119.7 kg)   SpO2 98%   BMI 41.32 kg/m     Wt Readings from Last 5 Encounters:  07/24/21 263 lb 12.8 oz (119.7 kg)  04/21/21 242 lb 8.1 oz (110 kg)  01/08/20 244 lb (110.7 kg)  10/03/17 262 lb (118.8 kg)  10/28/16 240 lb (108.9 kg)    GENERAL:  No apparent distress, AOx3 HEENT:  No carotid bruits, +2 carotid impulses, no scleral icterus CAR: RRR no murmurs, gallops, rubs, or thrills RES:  Clear to auscultation bilaterally ABD:  Soft, nontender, nondistended, positive bowel sounds x 4 VASC:  +2 radial pulses, +2 carotid pulses, palpable pedal pulses NEURO:  CN 2-12 grossly intact; motor and sensory grossly intact PSYCH:  No active depression or anxiety EXT:  No edema, ecchymosis, or cyanosis  ASSESSMENT:    1. Precordial pain   2. Aortic atherosclerosis (HCC)   3. Type 2 diabetes mellitus without complication, without long-term current use of insulin (HCC)   4. Hyperlipidemia, unspecified hyperlipidemia type    PLAN:    Precordial pain The patient had 1 episode of chest pain likely related to anxiety.  She has had no other episodes of exertional angina.  For now we will monitor the patient's symptoms.  If she has any signs or symptoms worrisome for angina we will refer her for coronary CTA study or Lexiscan due to an arthritic hip.  Of  note her PE CT demonstrated no coronary calcium which is reassuring.  I will obtain an echocardiogram given her episodic shortness of breath.  I will see her back in 6 months or earlier if needed.  Aortic atherosclerosis (HCC) Her LDL goal based on this is <216 and if possible, <70.  Though her LDL is in the 80s, she will benefit from moderate dose atorvastatin.  Type 2 diabetes mellitus without complication, without long-term current use of insulin (HCC) Start atorvastatin 40mg , losartan 25mg , and Jardiance 10mg  given her T2DM.  Hyperlipidemia, unspecified hyperlipidemia type See discussion above, this is followed by her PCP.   Shared Decision Making/Informed Consent:       Medication Adjustments/Labs and Tests Ordered: Current medicines are reviewed at length with the patient today.  Concerns regarding medicines are outlined above.   No orders of the defined types were placed in this encounter.   No orders of the defined types were placed in this encounter.   There are no Patient Instructions on file for this visit.

## 2021-07-24 ENCOUNTER — Ambulatory Visit (INDEPENDENT_AMBULATORY_CARE_PROVIDER_SITE_OTHER): Payer: 59 | Admitting: Internal Medicine

## 2021-07-24 ENCOUNTER — Other Ambulatory Visit: Payer: Self-pay

## 2021-07-24 ENCOUNTER — Encounter: Payer: Self-pay | Admitting: Internal Medicine

## 2021-07-24 VITALS — BP 146/98 | HR 96 | Ht 67.0 in | Wt 263.8 lb

## 2021-07-24 DIAGNOSIS — E785 Hyperlipidemia, unspecified: Secondary | ICD-10-CM

## 2021-07-24 DIAGNOSIS — I700 Atherosclerosis of aorta: Secondary | ICD-10-CM | POA: Diagnosis not present

## 2021-07-24 DIAGNOSIS — R072 Precordial pain: Secondary | ICD-10-CM

## 2021-07-24 DIAGNOSIS — E119 Type 2 diabetes mellitus without complications: Secondary | ICD-10-CM | POA: Diagnosis not present

## 2021-07-24 MED ORDER — EMPAGLIFLOZIN 10 MG PO TABS: 10.0000 mg | ORAL_TABLET | Freq: Every day | ORAL | 6 refills | Status: DC

## 2021-07-24 MED ORDER — LOSARTAN POTASSIUM 25 MG PO TABS: 25.0000 mg | ORAL_TABLET | Freq: Every day | ORAL | 3 refills | Status: DC

## 2021-07-24 MED ORDER — ATORVASTATIN CALCIUM 40 MG PO TABS: 40.0000 mg | ORAL_TABLET | Freq: Every day | ORAL | 3 refills | Status: DC

## 2021-07-24 NOTE — Patient Instructions (Signed)
Medication Instructions:  Your physician has recommended you make the following change in your medication:  1.) start atorvastatin (Lipitor) 40 mg - take one tablet daily 2.) start losartan (Cozaar) 25 mg - take one tablet daily 3.) start Jardiance 10 mg - take one tablet daily  *If you need a refill on your cardiac medications before your next appointment, please call your pharmacy*   Lab Work: Please return in 12 weeks for blood work (lipids, liver fx)   Testing/Procedures: Your physician has requested that you have an echocardiogram. Echocardiography is a painless test that uses sound waves to create images of your heart. It provides your doctor with information about the size and shape of your heart and how well your heart's chambers and valves are working. This procedure takes approximately one hour. There are no restrictions for this procedure.   Follow-Up: At [facility], you and your health needs are our priority.  As part of our continuing mission to provide you with exceptional heart care, we have created designated Provider Care Teams.  These Care Teams include your primary Cardiologist (physician) and Advanced Practice Providers (APPs -  Physician Assistants and Nurse Practitioners) who all work together to provide you with the care you need, when you need it.  We recommend signing up for the patient portal called "MyChart".  Sign up information is provided on this After Visit Summary.  MyChart is used to connect with patients for Virtual Visits (Telemedicine).  Patients are able to view lab/test results, encounter notes, upcoming appointments, etc.  Non-urgent messages can be sent to your provider as well.   To learn more about what you can do with MyChart, go to [email or web address].    Your next appointment:   6 month(s)  The format for your next appointment:   In Person  Provider:   Orbie Pyo, MD     Other Instructions

## 2021-08-28 ENCOUNTER — Encounter (HOSPITAL_COMMUNITY): Payer: Self-pay | Admitting: Internal Medicine

## 2021-08-28 ENCOUNTER — Ambulatory Visit (HOSPITAL_COMMUNITY): Payer: Medicaid Other | Attending: Internal Medicine

## 2021-10-16 ENCOUNTER — Other Ambulatory Visit: Payer: 59

## 2021-11-18 ENCOUNTER — Other Ambulatory Visit: Payer: Medicaid Other

## 2022-01-20 ENCOUNTER — Encounter (HOSPITAL_COMMUNITY): Payer: Self-pay

## 2022-01-20 ENCOUNTER — Ambulatory Visit (HOSPITAL_COMMUNITY)
Admission: EM | Admit: 2022-01-20 | Discharge: 2022-01-20 | Disposition: A | Discharge status: Home / Self Care | Payer: Commercial Managed Care - HMO | Attending: Emergency Medicine | Admitting: Emergency Medicine

## 2022-01-20 DIAGNOSIS — R197 Diarrhea, unspecified: Secondary | ICD-10-CM | POA: Diagnosis not present

## 2022-01-20 MED ORDER — LOPERAMIDE HCL 2 MG PO CAPS: 2.0000 mg | ORAL_CAPSULE | Freq: Four times a day (QID) | ORAL | 0 refills | Status: DC | PRN

## 2022-01-20 NOTE — Discharge Instructions (Signed)
On exam no tenderness to your abdomen and your vital signs appear to be stable, I have a low suspicion that this is being caused by more serious organ involvement and could possibly be a virus that will work its way out of your system with time  Your diarrhea should not be related to the new changes in your medication as diarrhea is not a common side effect of Jardiance  You may use Imodium every 6 hours as needed to help slow diarrhea  Increase your fluid intake through use of water and sugar-free electrolyte replacement substances such as Gatorade or similar product to balance out your output and prevent dehydration  If your symptoms continue to persist you may follow-up with urgent care or your primary doctor for reevaluation  If your diarrhea continues to persist past use of medication and you begin to feel dizzy, lightheaded or your fatigue or weakness worsens please go to the nearest emergency department for evaluation of IV fluid replacement

## 2022-01-20 NOTE — ED Provider Notes (Signed)
[facility]    CSN: [id number] Arrival date & time: 01/20/22  1235      History   Chief Complaint Chief Complaint  Patient presents with   Diarrhea   Nausea   Generalized Body Aches    HPI Rachael Robinson is a 56 y.o. female.   Patient presents with body aches, fatigue, diarrhea and intermittent cramping abdominal pain occurring only before bowel movements for 2 days.  Endorses having at least 6 bowel movements yesterday with 1-2 occurring today.  Tolerating food and liquids.  No known sick contacts.  Has not attempted treatment of symptoms.  Denies nausea, vomiting, bloating, increased gas production, heartburn or indigestion, fevers, changes in diet or recent travel.  Endorses that she started Jardiance for diabetes 9 days ago.    Past Medical History:  Diagnosis Date   Arthritis    right hip   Chest pain    Diabetes mellitus without complication [facility])     Patient Active Problem List   Diagnosis Date Noted   Aortic atherosclerosis (HCC) 06/24/2021   Precordial chest pain 06/24/2021   Arthritis of right hip 12/19/2019   Pain in joint of right hip 10/14/2017    Past Surgical History:  Procedure Laterality Date   KNEE SURGERY  [id number]   x2   TUBAL LIGATION  1991    OB History   No obstetric history on file.      Home Medications    Prior to Admission medications   Medication Sig Start Date End Date Taking? Authorizing Provider  acetaminophen (TYLENOL) 650 MG CR tablet Take 650 mg by mouth every 8 (eight) hours as needed for pain.    [provider]  ASPIRIN 81 PO Take by mouth.    [provider]  atorvastatin (LIPITOR) 40 MG tablet Take 1 tablet (40 mg total) by mouth daily. 07/24/21   Orbie Pyo, MD  empagliflozin (JARDIANCE) 10 MG TABS tablet Take 1 tablet (10 mg total) by mouth daily before breakfast. 07/24/21   Orbie Pyo, MD  glipiZIDE (GLUCOTROL XL) 2.5 MG 24 hr tablet Take 2.5 mg by mouth every morning.  06/28/21   [provider]  losartan (COZAAR) 25 MG tablet Take 1 tablet (25 mg total) by mouth daily. 07/24/21   Orbie Pyo, MD  Na Sulfate-K Sulfate-Mg Sulf 17.5-3.13-1.6 GM/177ML SOLN Suprep (no substitutions)-TAKE AS DIRECTED. Patient not taking: Reported on 07/24/2021 01/08/20   Benancio Deeds, MD  ofloxacin (OCUFLOX) 0.3 % ophthalmic solution  01/05/20   [provider]    Family History Family History  Problem Relation Age of Onset   Heart disease Mother    Diabetes Mother    Hyperlipidemia Mother    Kidney disease Mother    Thyroid disease Mother    Diabetes Sister    Stroke Sister    Heart disease Brother    Colon cancer Brother 6   Cancer Sister    Breast cancer Sister    Colon polyps Neg Hx    Esophageal cancer Neg Hx    Rectal cancer Neg Hx    Stomach cancer Neg Hx     Social History Social History   Tobacco Use   Smoking status: Every Day    Packs/day: 0.50    Types: Cigarettes   Smokeless tobacco: Never  Vaping Use   Vaping Use: Never used  Substance Use Topics   Alcohol use: No   Drug use: No     Allergies  Patient has no known allergies.   Review of Systems Review of Systems  Constitutional:  Positive for fatigue. Negative for activity change, appetite change, chills, diaphoresis, fever and unexpected weight change.  Respiratory: Negative.    Cardiovascular: Negative.   Gastrointestinal:  Positive for abdominal pain and diarrhea. Negative for abdominal distention, anal bleeding, blood in stool, constipation, nausea, rectal pain and vomiting.  Musculoskeletal: Negative.   Skin: Negative.     Physical Exam Triage Vital Signs ED Triage Vitals [01/20/22 1304]  Enc Vitals Group     BP 119/74     Pulse Rate 91     Resp 16     Temp 98.1 F (36.7 C)     Temp Source Oral     SpO2 97 %     Weight      Height      Head Circumference      Peak Flow      Pain Score      Pain Loc      Pain Edu?      Excl. in GC?     No data found.  Updated Vital Signs BP 119/74 (BP Location: Left Arm)   Pulse 91   Temp 98.1 F (36.7 C) (Oral)   Resp 16   SpO2 97%   Visual Acuity Right Eye Distance:   Left Eye Distance:   Bilateral Distance:    Right Eye Near:   Left Eye Near:    Bilateral Near:     Physical Exam Constitutional:      Appearance: Normal appearance.  HENT:     Head: Normocephalic.  Eyes:     Extraocular Movements: Extraocular movements intact.  Pulmonary:     Effort: Pulmonary effort is normal.  Abdominal:     General: Abdomen is flat. Bowel sounds are normal. There is no distension.     Palpations: Abdomen is soft.     Tenderness: There is no abdominal tenderness. There is no right CVA tenderness, left CVA tenderness or guarding.  Skin:    General: Skin is warm and dry.  Neurological:     Mental Status: She is alert and oriented to person, place, and time. Mental status is at baseline.  Psychiatric:        Mood and Affect: Mood normal.        Behavior: Behavior normal.     UC Treatments / Results  Labs (all labs ordered are listed, but only abnormal results are displayed) Labs Reviewed - No data to display  EKG   Radiology No results found.  Procedures Procedures (including critical care time)  Medications Ordered in UC Medications - No data to display  Initial Impression / Assessment and Plan / UC Course  I have reviewed the triage vital signs and the nursing notes.  Pertinent labs & imaging results that were available during my care of the patient were reviewed by me and considered in my medical decision making (see chart for details).  Diarrhea  Vital signs are stable, no tenderness is noted on abdominal exam, low suspicion for an acute abdomen, etiology possibly viral, low suspicion for cause to be related to medication as diarrhea is not a adverse effect of Jardiance, discussed with patient, Imodium prescribed for outpatient management, advised increase  fluid intake 3 usage water and sugar-free electrolyte replacement substances for prevention of dehydration, for persistent symptoms patient may follow-up with urgent care or PCP for worsening symptoms or further signs of dehydration patient to go to  the nearest emergency department for evaluation of intravenous fluids, work note given Final Clinical Impressions(s) / UC Diagnoses   Final diagnoses:  None   Discharge Instructions   None    ED Prescriptions   None    PDMP not reviewed this encounter.   Valinda HoarWhite, Kebrina Friend R, NP 01/20/22 1353

## 2022-01-20 NOTE — ED Triage Notes (Signed)
C/o body aches, fatigue and diarrhea x 2- days. Pt reports 6 diarrhea stools today.

## 2022-02-10 NOTE — Progress Notes (Signed)
  Cardiology Office Note:    Date:  02/10/2022   ID:  Rachael Robinson, DOB [birth year], MRN [id number]  PCP:  Irena Reichmann, DO   [facility] Providers Cardiologist:  Alverda Skeans, MD Referring MD: Irena Reichmann, DO   Chief Complaint/Reason for Referral: Follow-up  PATIENT DID NOT APPEAR FOR APPOINTMENT  ASSESSMENT:    1. Precordial pain   2. Type 2 diabetes mellitus without complication, without long-term current use of insulin (HCC)   3. Hypertension associated with diabetes (HCC)   4. Hyperlipidemia associated with type 2 diabetes mellitus (HCC)   5. Aortic atherosclerosis (HCC)     PLAN:    In order of problems listed above: 1.  Precordial pain: 2.  Type 2 diabetes: Continue aspirin, losartan, statin, and Jardiance. 3.  Hypertension: Continue losartan with goal blood pressure of less than 130/80 mmHg. 4.  Hyperlipidemia: This is being followed by the patient's primary care provider.  Patient's goal LDL is less than 70. 5.  Aortic atherosclerosis: Continue aspirin, statin with goal LDL of less than 70, and strict blood pressure control.    History of Present Illness:    FOCUSED PROBLEM LIST:   1.  Tobacco abuse 2.  Type 2 diabetes on oral medication 3.  Aortic atherosclerosis on CT 2022 4.  Hypertension 5.  Hyperlipidemia  The patient is a 56 y.o. female with the indicated medical history here for cardiology follow-up.  December 2022: The patient was seen for emergency room visit follow-up.  She was seen for chest pain.  It was thought to be related to stress.  Her evaluation emergency department was unremarkable.  She was started on aspirin 81 mg, atorvastatin 40 mg, losartan 25 mg, and Jardiance 10 mg.  An echocardiogram was ordered for episodic shortness of breath but this was not completed.  Today:   EKGs/Labs/Other Test Reviewed:    EKG:  EKG performed December 2022 that I personally reviewed demonstrates this rhythm  Prior CV studies: None  available    Other studies Reviewed: Review of the additional studies/records demonstrates: PE protocol CT 2022 demonstrates aortic atherosclerosis and no coronary artery calcification  Recent Labs: 04/21/2021: ALT 29; BUN 21; Creatinine, Ser 0.90; Hemoglobin 15.4; Platelets 167; Potassium 4.1; Sodium 139   Recent Lipid Panel No results found for: "CHOL", "TRIG", "HDL", "LDLCALC", "LDLDIRECT"  Risk Assessment/Calculations:         Signed, Orbie Pyo, MD  02/10/2022 10:34 AM    [facility] [address] [place], [place], [place]  [address] Phone: [phone]; Fax: [phone]   Note:  This document was prepared using Dragon voice recognition software and may include unintentional dictation errors.

## 2022-02-13 ENCOUNTER — Ambulatory Visit (INDEPENDENT_AMBULATORY_CARE_PROVIDER_SITE_OTHER): Payer: Self-pay | Admitting: Internal Medicine

## 2022-02-13 DIAGNOSIS — I700 Atherosclerosis of aorta: Secondary | ICD-10-CM

## 2022-02-13 DIAGNOSIS — I152 Hypertension secondary to endocrine disorders: Secondary | ICD-10-CM

## 2022-02-13 DIAGNOSIS — E119 Type 2 diabetes mellitus without complications: Secondary | ICD-10-CM

## 2022-02-13 DIAGNOSIS — E1169 Type 2 diabetes mellitus with other specified complication: Secondary | ICD-10-CM

## 2022-02-13 DIAGNOSIS — R072 Precordial pain: Secondary | ICD-10-CM

## 2022-02-13 DIAGNOSIS — E1159 Type 2 diabetes mellitus with other circulatory complications: Secondary | ICD-10-CM

## 2022-02-13 DIAGNOSIS — E785 Hyperlipidemia, unspecified: Secondary | ICD-10-CM

## 2022-03-05 ENCOUNTER — Ambulatory Visit (INDEPENDENT_AMBULATORY_CARE_PROVIDER_SITE_OTHER): Payer: Commercial Managed Care - HMO

## 2022-03-05 ENCOUNTER — Ambulatory Visit
Admission: EM | Admit: 2022-03-05 | Discharge: 2022-03-05 | Disposition: A | Discharge status: Home / Self Care | Payer: Commercial Managed Care - HMO | Attending: Family Medicine | Admitting: Family Medicine

## 2022-03-05 DIAGNOSIS — M25551 Pain in right hip: Secondary | ICD-10-CM | POA: Diagnosis not present

## 2022-03-05 MED ORDER — KETOROLAC TROMETHAMINE 30 MG/ML IJ SOLN
30.0000 mg | Freq: Once | INTRAMUSCULAR | Status: AC
  Administered 2022-03-05: 30 mg via INTRAMUSCULAR

## 2022-03-05 NOTE — Discharge Instructions (Addendum)
The x-rays did not show any broken bones.  There was a lot of arthritis on your hip x-ray  You have been given a shot of Toradol 30 mg today.  Tramadol 50 mg--1 every 6 hours as needed for pain.  This medication can make you sleepy or dizzy  Call [facility] today and see if they take your new insurance

## 2022-03-05 NOTE — ED Triage Notes (Signed)
Patient presents to Urgent Care with complaints of right hip pain that comes and goes and is 10/10 achey since 4 days ago. Patient reports motrin this morning. Recent change in insurance needs new orthopedic doc pmh of arthritis in hip

## 2022-03-05 NOTE — ED Provider Notes (Signed)
[facility]    CSN: [id number] Arrival date & time: 03/05/22  1224      History   Chief Complaint Chief Complaint  Patient presents with   Hip Pain    HPI Rachael Robinson is a 56 y.o. female.    Hip Pain   Here for worsening right hip pain in the last few days.  No known recent trauma or fall.  She has been known to have arthritis in her right hip.  Has been seen by orthopedics, but her insurance is changed and she is got to find a new one.  No fever or rash  She does have a history of diabetes which is fairly well controlled lately  Past Medical History:  Diagnosis Date   Arthritis    right hip   Chest pain    Diabetes mellitus without complication [facility])     Patient Active Problem List   Diagnosis Date Noted   Aortic atherosclerosis (HCC) 06/24/2021   Precordial chest pain 06/24/2021   Arthritis of right hip 12/19/2019   Pain in joint of right hip 10/14/2017    Past Surgical History:  Procedure Laterality Date   KNEE SURGERY  [id number]   x2   TUBAL LIGATION  1991    OB History   No obstetric history on file.      Home Medications    Prior to Admission medications   Medication Sig Start Date End Date Taking? Authorizing Provider  acetaminophen (TYLENOL) 650 MG CR tablet Take 650 mg by mouth every 8 (eight) hours as needed for pain.    [provider]  ASPIRIN 81 PO Take by mouth.    [provider]  atorvastatin (LIPITOR) 40 MG tablet Take 1 tablet (40 mg total) by mouth daily. Patient not taking: Reported on 03/05/2022 07/24/21   Orbie Pyo, MD  empagliflozin (JARDIANCE) 10 MG TABS tablet Take 1 tablet (10 mg total) by mouth daily before breakfast. 07/24/21   Orbie Pyo, MD  glipiZIDE (GLUCOTROL XL) 2.5 MG 24 hr tablet Take 2.5 mg by mouth every morning. 06/28/21   [provider]  loperamide (IMODIUM) 2 MG capsule Take 1 capsule (2 mg total) by mouth 4 (four) times daily as needed for diarrhea  or loose stools. 01/20/22   White, Elita Boone, NP  losartan (COZAAR) 25 MG tablet Take 1 tablet (25 mg total) by mouth daily. Patient not taking: Reported on 03/05/2022 07/24/21   Orbie Pyo, MD  Na Sulfate-K Sulfate-Mg Sulf 17.5-3.13-1.6 GM/177ML SOLN Suprep (no substitutions)-TAKE AS DIRECTED. Patient not taking: Reported on 07/24/2021 01/08/20   Benancio Deeds, MD  ofloxacin (OCUFLOX) 0.3 % ophthalmic solution  01/05/20   [provider]    Family History Family History  Problem Relation Age of Onset   Heart disease Mother    Diabetes Mother    Hyperlipidemia Mother    Kidney disease Mother    Thyroid disease Mother    Diabetes Sister    Stroke Sister    Heart disease Brother    Colon cancer Brother 32   Cancer Sister    Breast cancer Sister    Colon polyps Neg Hx    Esophageal cancer Neg Hx    Rectal cancer Neg Hx    Stomach cancer Neg Hx     Social History Social History   Tobacco Use   Smoking status: Every Day    Packs/day: 0.50    Types: Cigarettes  Smokeless tobacco: Never  Vaping Use   Vaping Use: Never used  Substance Use Topics   Alcohol use: No   Drug use: No     Allergies   Patient has no known allergies.   Review of Systems Review of Systems   Physical Exam Triage Vital Signs ED Triage Vitals  Enc Vitals Group     BP 03/05/22 1256 (!) 156/93     Pulse Rate 03/05/22 1256 95     Resp 03/05/22 1256 18     Temp 03/05/22 1256 97.9 F (36.6 C)     Temp src --      SpO2 03/05/22 1256 98 %     Weight --      Height --      Head Circumference --      Peak Flow --      Pain Score 03/05/22 1254 10     Pain Loc --      Pain Edu? --      Excl. in GC? --    No data found.  Updated Vital Signs BP (!) 156/93   Pulse 95   Temp 97.9 F (36.6 C)   Resp 18   LMP 08/01/2017 (Approximate) Comment: preg test waiver signed  SpO2 98%   Visual Acuity Right Eye Distance:   Left Eye Distance:   Bilateral Distance:    Right Eye  Near:   Left Eye Near:    Bilateral Near:     Physical Exam Constitutional:      General: She is not in acute distress.    Appearance: She is not ill-appearing, toxic-appearing or diaphoretic.  Musculoskeletal:     Comments: She is tender in her right lateral hip.  No erythema or deformity or rashes seen.  Neurological:     Mental Status: She is alert and oriented to person, place, and time.  Psychiatric:        Behavior: Behavior normal.      UC Treatments / Results  Labs (all labs ordered are listed, but only abnormal results are displayed) Labs Reviewed - No data to display  EKG   Radiology DG Hip Unilat With Pelvis 2-3 Views Right  Result Date: 03/05/2022 CLINICAL DATA:  Right hip pain. EXAM: DG HIP (WITH OR WITHOUT PELVIS) 2-3V RIGHT COMPARISON:  10/03/2017 FINDINGS: Examination limited by clothing artifact. Both hips are normally located. Significant/severe right hip joint degenerative changes and moderate left hip joint degenerative changes. No fracture or AVN. The pubic symphysis and SI joints are intact. No pelvic fractures or bone lesions. IMPRESSION: Significant right and moderate left hip joint degenerative changes. Electronically Signed   By: Rudie Meyer M.D.   On: 03/05/2022 13:46    Procedures Procedures (including critical care time)  Medications Ordered in UC Medications  ketorolac (TORADOL) 30 MG/ML injection 30 mg (has no administration in time range)    Initial Impression / Assessment and Plan / UC Course  I have reviewed the triage vital signs and the nursing notes.  Pertinent labs & imaging results that were available during my care of the patient were reviewed by me and considered in my medical decision making (see chart for details).     There is nothing on pmp X-rays show severe osteoarthritis of the right hip.  There is no bony cyst or other bony lesion.  We will treat with some tramadol and Toradol.  She is going to see if her current  orthopedics specialist takes her insurance Final Clinical  Impressions(s) / UC Diagnoses   Final diagnoses:  Pain in joint of right hip     Discharge Instructions      The x-rays did not show any broken bones.  There was a lot of arthritis on your hip x-ray  You have been given a shot of Toradol 30 mg today.  Tramadol 50 mg--1 every 6 hours as needed for pain.  This medication can make you sleepy or dizzy  Call [facility] today and see if they take your new insurance     ED Prescriptions   None    I have reviewed the PDMP during this encounter.   Zenia Resides, MD 03/05/22 [phone]

## 2022-03-13 ENCOUNTER — Ambulatory Visit: Payer: Commercial Managed Care - HMO | Admitting: Orthopaedic Surgery

## 2022-03-17 ENCOUNTER — Ambulatory Visit (INDEPENDENT_AMBULATORY_CARE_PROVIDER_SITE_OTHER): Payer: Commercial Managed Care - HMO | Admitting: Orthopaedic Surgery

## 2022-03-17 DIAGNOSIS — M1611 Unilateral primary osteoarthritis, right hip: Secondary | ICD-10-CM | POA: Insufficient documentation

## 2022-03-17 NOTE — Progress Notes (Signed)
Office Visit Note   Patient: Rachael Robinson           Date of Birth: 17-Jan-1966           MRN: [id number] Visit Date: 03/17/2022              Requested by: Irena Reichmann, DO [address] [place],  [place] [address] PCP: Irena Reichmann, DO   Assessment & Plan: Visit Diagnoses:  1. Primary osteoarthritis of right hip     Plan: Impression is end-stage right hip DJD.  She understands that her hip joint is bone-on-bone and a hip replacement is the best option for relatively permanent relief.  She understands that her weight and BMI are currently barriers to having a hip replacement due to increased risk of complications.  She will make all efforts at weight loss.  Her goal weight is around 250 pounds.  In the meantime she will also work on smoking cessation.  We will make a referral to Dr. Denyse Amass for ultrasound-guided hip injection which will hopefully tide her over until she can have a hip replacement.  Follow-Up Instructions: No follow-ups on file.   Orders:  No orders of the defined types were placed in this encounter.  No orders of the defined types were placed in this encounter.     Procedures: No procedures performed   Clinical Data: No additional findings.   Subjective: Chief Complaint  Patient presents with   Right Hip - Pain    HPI Rachael Robinson is a very pleasant 56 year old female who comes into be evaluated for worsening right hip pain.  She had previously been to [facility] for the same problem of right hip DJD.  She received cortisone injection in her right hip about a year ago which gave excellent relief.  She denies any injuries.  She has severe pain after work.  Works in Furniture conservator/restorer.  Is diabetic and smokes about half pack a day.  Takes over-the-counter medications with temporary relief.  Overall well-controlled diabetic.  Severe pain is interfering with quality of life and daily activities as well as ability to work. Review of Systems  Constitutional:  Negative.   HENT: Negative.    Eyes: Negative.   Respiratory: Negative.    Cardiovascular: Negative.   Endocrine: Negative.   Musculoskeletal: Negative.   Neurological: Negative.   Hematological: Negative.   Psychiatric/Behavioral: Negative.    All other systems reviewed and are negative.    Objective: Vital Signs: LMP 08/01/2017 (Approximate) Comment: preg test waiver signed  Physical Exam Vitals and nursing note reviewed.  Constitutional:      Appearance: She is well-developed.  HENT:     Head: Atraumatic.     Nose: Nose normal.  Eyes:     Extraocular Movements: Extraocular movements intact.  Cardiovascular:     Pulses: Normal pulses.  Pulmonary:     Effort: Pulmonary effort is normal.  Abdominal:     Palpations: Abdomen is soft.  Musculoskeletal:     Cervical back: Neck supple.  Skin:    General: Skin is warm.     Capillary Refill: Capillary refill takes less than 2 seconds.  Neurological:     Mental Status: She is alert. Mental status is at baseline.  Psychiatric:        Behavior: Behavior normal.        Thought Content: Thought content normal.        Judgment: Judgment normal.     Ortho Exam Examination of right hip  shows very limited hip flexion and range of motion secondary to guarding and severe pain.  Pain with logroll. Specialty Comments:  No specialty comments available.  Imaging: No results found.   PMFS History: Patient Active Problem List   Diagnosis Date Noted   Primary osteoarthritis of right hip 03/17/2022   Aortic atherosclerosis (HCC) 06/24/2021   Precordial chest pain 06/24/2021   Arthritis of right hip 12/19/2019   Pain in joint of right hip 10/14/2017   Past Medical History:  Diagnosis Date   Arthritis    right hip   Chest pain    Diabetes mellitus without complication (HCC)     Family History  Problem Relation Age of Onset   Heart disease Mother    Diabetes Mother    Hyperlipidemia Mother    Kidney disease Mother     Thyroid disease Mother    Diabetes Sister    Stroke Sister    Heart disease Brother    Colon cancer Brother 36   Cancer Sister    Breast cancer Sister    Colon polyps Neg Hx    Esophageal cancer Neg Hx    Rectal cancer Neg Hx    Stomach cancer Neg Hx     Past Surgical History:  Procedure Laterality Date   KNEE SURGERY  [phone]   x2   TUBAL LIGATION  1991   Social History   Occupational History   Not on file  Tobacco Use   Smoking status: Every Day    Packs/day: 0.50    Types: Cigarettes   Smokeless tobacco: Never  Vaping Use   Vaping Use: Never used  Substance and Sexual Activity   Alcohol use: No   Drug use: No   Sexual activity: Yes    Birth control/protection: Surgical

## 2022-03-26 ENCOUNTER — Ambulatory Visit (HOSPITAL_BASED_OUTPATIENT_CLINIC_OR_DEPARTMENT_OTHER): Payer: Commercial Managed Care - HMO | Admitting: Orthopaedic Surgery

## 2022-03-26 DIAGNOSIS — M25551 Pain in right hip: Secondary | ICD-10-CM

## 2022-03-26 DIAGNOSIS — M1611 Unilateral primary osteoarthritis, right hip: Secondary | ICD-10-CM

## 2022-03-26 MED ORDER — TRIAMCINOLONE ACETONIDE 40 MG/ML IJ SUSP
80.0000 mg | INTRAMUSCULAR | Status: AC | PRN
  Administered 2022-03-26: 80 mg via INTRA_ARTICULAR

## 2022-03-26 MED ORDER — LIDOCAINE HCL 1 % IJ SOLN
4.0000 mL | INTRAMUSCULAR | Status: AC | PRN
  Administered 2022-03-26: 4 mL

## 2022-03-26 NOTE — Progress Notes (Signed)
Chief Complaint: Right hip.     History of Present Illness:    Rachael Robinson is a 56 y.o. female presents today for right hip injection in the setting of known right hip osteoarthritis currently being managed by Dr. Roda Shutters.  She is working on weight loss as well as diabetic control and in the interim has elected for a right hip femoral acetabular injection to help with pain control    Surgical History:   None  PMH/PSH/Family History/Social History/Meds/Allergies:    Past Medical History:  Diagnosis Date  . Arthritis    right hip  . Chest pain   . Diabetes mellitus without complication [facility])    Past Surgical History:  Procedure Laterality Date  . KNEE SURGERY  [phone]   x2  . TUBAL LIGATION  1991   Social History   Socioeconomic History  . Marital status: Single    Spouse name: Not on file  . Number of children: Not on file  . Years of education: Not on file  . Highest education level: Not on file  Occupational History  . Not on file  Tobacco Use  . Smoking status: Every Day    Packs/day: 0.50    Types: Cigarettes  . Smokeless tobacco: Never  Vaping Use  . Vaping Use: Never used  Substance and Sexual Activity  . Alcohol use: No  . Drug use: No  . Sexual activity: Yes    Birth control/protection: Surgical  Other Topics Concern  . Not on file  Social History Narrative  . Not on file   Social Determinants of Health   Financial Resource Strain: Not on file  Food Insecurity: Not on file  Transportation Needs: Not on file  Physical Activity: Not on file  Stress: Not on file  Social Connections: Not on file   Family History  Problem Relation Age of Onset  . Heart disease Mother   . Diabetes Mother   . Hyperlipidemia Mother   . Kidney disease Mother   . Thyroid disease Mother   . Diabetes Sister   . Stroke Sister   . Heart disease Brother   . Colon cancer Brother 3  . Cancer Sister   . Breast cancer Sister    . Colon polyps Neg Hx   . Esophageal cancer Neg Hx   . Rectal cancer Neg Hx   . Stomach cancer Neg Hx    No Known Allergies Current Outpatient Medications  Medication Sig Dispense Refill  . acetaminophen (TYLENOL) 650 MG CR tablet Take 650 mg by mouth every 8 (eight) hours as needed for pain.    . ASPIRIN 81 PO Take by mouth.    Marland Kitchen atorvastatin (LIPITOR) 40 MG tablet Take 1 tablet (40 mg total) by mouth daily. (Patient not taking: Reported on 03/05/2022) 90 tablet 3  . empagliflozin (JARDIANCE) 10 MG TABS tablet Take 1 tablet (10 mg total) by mouth daily before breakfast. 30 tablet 6  . glipiZIDE (GLUCOTROL XL) 2.5 MG 24 hr tablet Take 2.5 mg by mouth every morning.    . loperamide (IMODIUM) 2 MG capsule Take 1 capsule (2 mg total) by mouth 4 (four) times daily as needed for diarrhea or loose stools. 12 capsule 0  . losartan (COZAAR) 25 MG tablet Take 1 tablet (25 mg total) by mouth daily. (  Patient not taking: Reported on 03/05/2022) 90 tablet 3  . Na Sulfate-K Sulfate-Mg Sulf 17.5-3.13-1.6 GM/177ML SOLN Suprep (no substitutions)-TAKE AS DIRECTED. (Patient not taking: Reported on 07/24/2021) 354 mL 0  . ofloxacin (OCUFLOX) 0.3 % ophthalmic solution  (Patient not taking: Reported on 07/24/2021)     No current facility-administered medications for this visit.   No results found.  Review of Systems:   A ROS was performed including pertinent positives and negatives as documented in the HPI.  Physical Exam :   Constitutional: NAD and appears stated age Neurological: Alert and oriented Psych: Appropriate affect and cooperative Last menstrual period 08/01/2017.   Comprehensive Musculoskeletal Exam:    Tenderness with 20 degrees internal and external rotation of the hip.  Limited range of motion compared to the contralateral side.  Otherwise distal neurosensory exam is intact  Imaging:   Xray (2 views right hip): Severe femoral acetabular osteoarthritis   I personally reviewed and  interpreted the radiographs.   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  I have discussed risk and benefits with her.  I specifically advised on checking her blood sugar and additional couple of times later today.  She understands this and would like to proceed  Plan :    -Right hip ultrasound-guided injection performed after verbal consent obtained    Procedure Note  Patient: Rachael Robinson             Date of Birth: [birth year]           MRN: [id number]             Visit Date: 03/26/2022  Procedures: Visit Diagnoses: No diagnosis found.  Large Joint Inj: R hip joint on 03/26/2022 11:11 AM Indications: pain Details: 22 G 3.5 in needle, ultrasound-guided anterolateral approach  Arthrogram: No  Medications: 4 mL lidocaine 1 %; 80 mg triamcinolone acetonide 40 MG/ML Outcome: tolerated well, no immediate complications Procedure, treatment alternatives, risks and benefits explained, specific risks discussed. Consent was given by the patient. Immediately prior to procedure a time out was called to verify the correct patient, procedure, equipment, support staff and site/side marked as required. Patient was prepped and draped in the usual sterile fashion.          I personally saw and evaluated the patient, and participated in the management and treatment plan.  Huel Cote, MD Attending Physician, Orthopedic Surgery  This document was dictated using Dragon voice recognition software. A reasonable attempt at proof reading has been made to minimize errors.

## 2022-03-28 IMAGING — CT CT ANGIO CHEST
1 of 4 series · 17 of 36 positions shown · IV contrast (omnipaque)
Comparison: Chest x-ray from earlier in the same day.

CLINICAL DATA: Chest pain for several hours

EXAM:
CT ANGIOGRAPHY CHEST WITH CONTRAST
TECHNIQUE: Multidetector CT imaging of the chest was performed using the
standard protocol during bolus administration of intravenous
contrast. Multiplanar CT image reconstructions and MIPs were
obtained to evaluate the vascular anatomy.
CONTRAST:  80mL OMNIPAQUE IOHEXOL 350 MG/ML SOLN

[Series 5: thins · axial · 0.71mm/px · z∈[+65,+292]mm · 17 of 257 slices shown]
[im 15/257  lung]
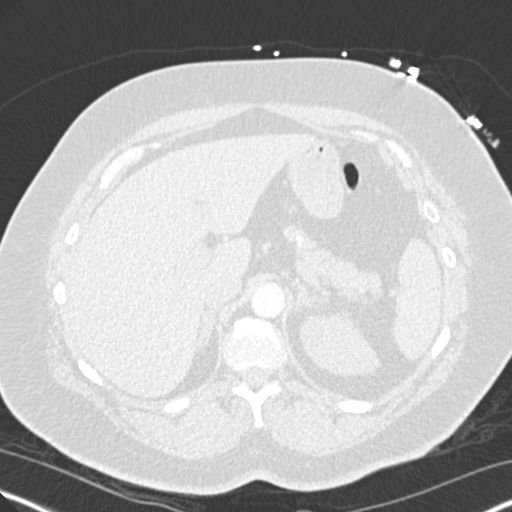
[im 29/257  mediastinal]
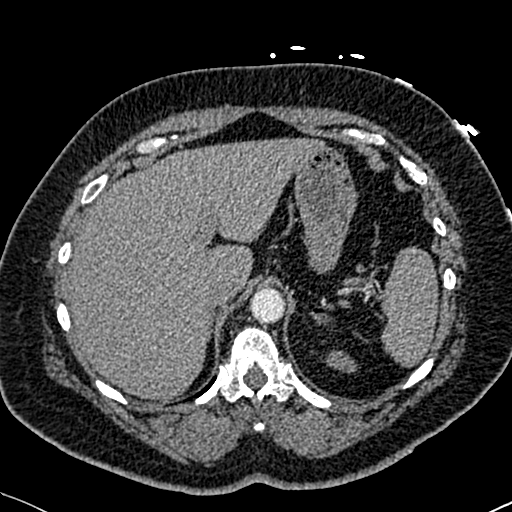
[im 43/257  lung]
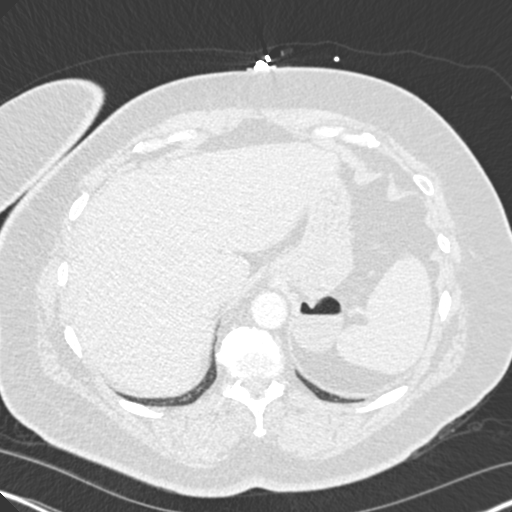
[im 57/257  mediastinal]
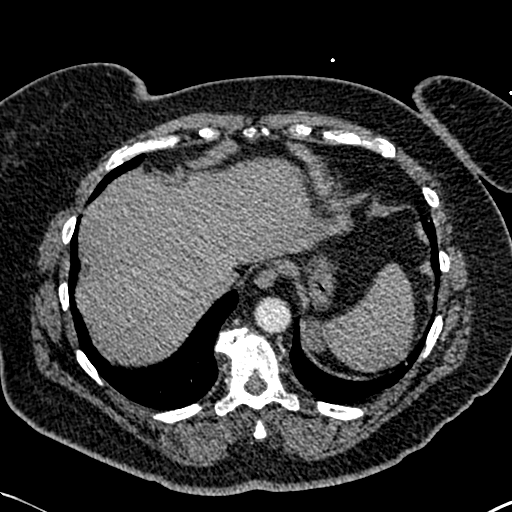
[im 72/257  lung]
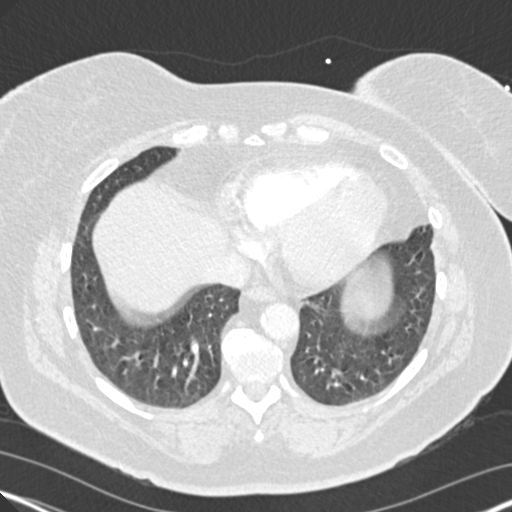
[im 86/257  mediastinal]
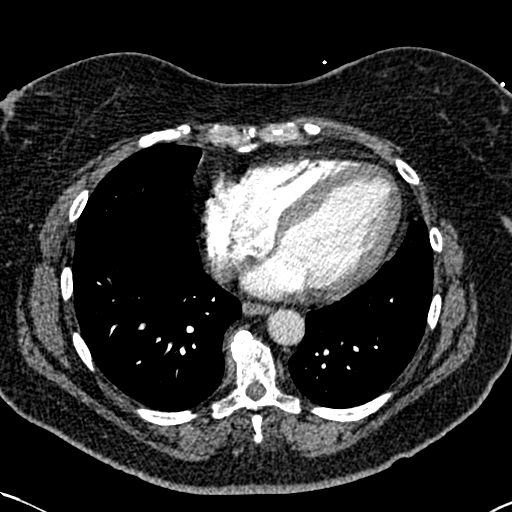
[im 100/257  lung]
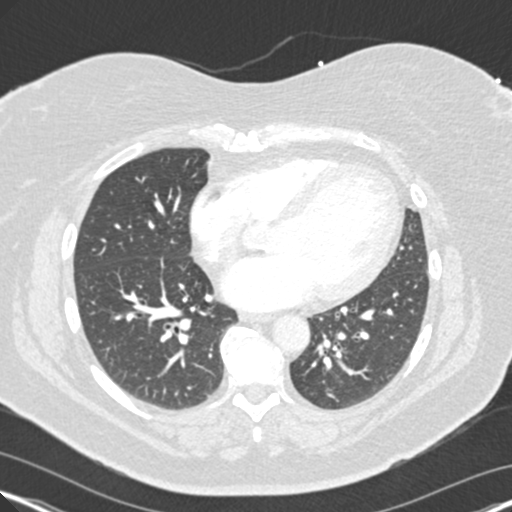
[im 114/257  mediastinal]
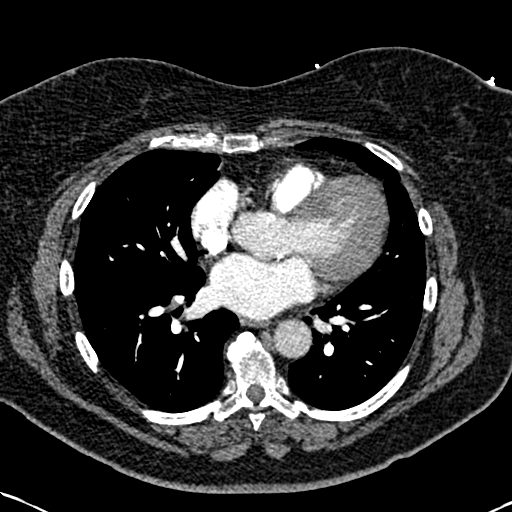
[im 129/257  lung]
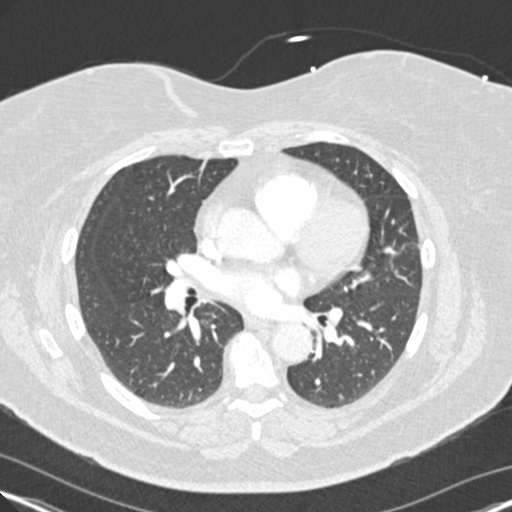
[im 143/257  mediastinal]
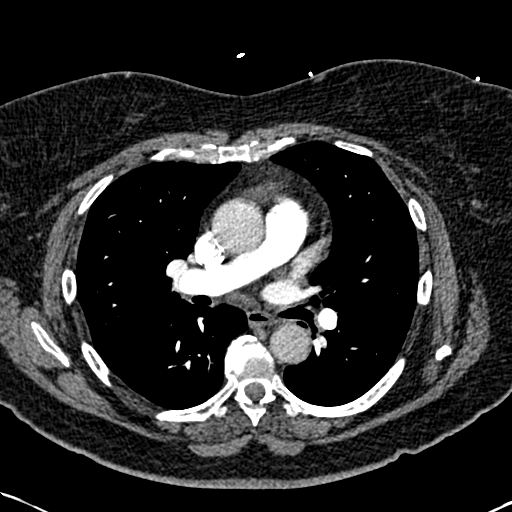
[im 157/257  lung]
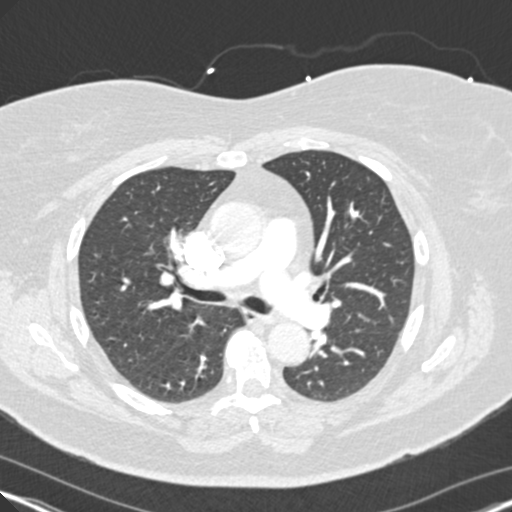
[im 171/257  mediastinal]
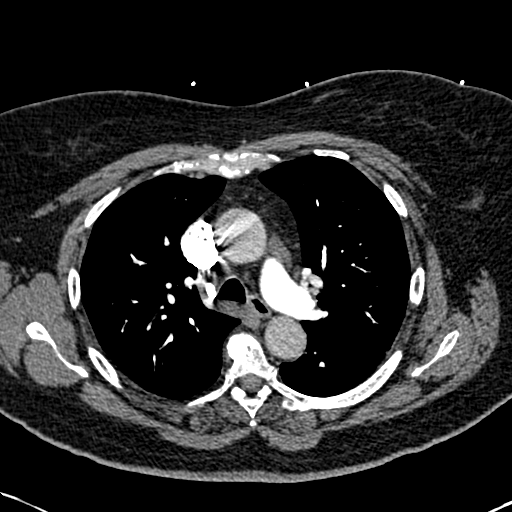
[im 185/257  lung]
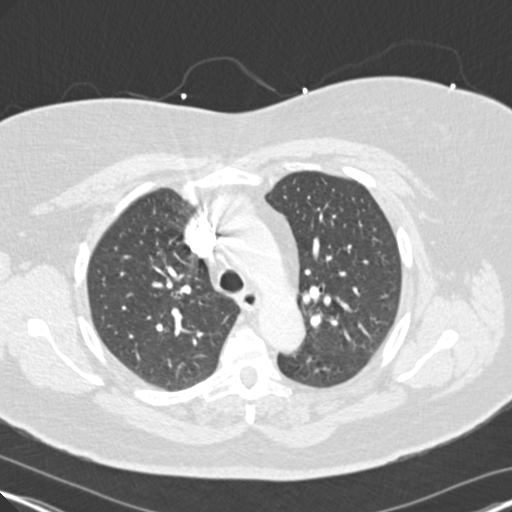
[im 200/257  mediastinal]
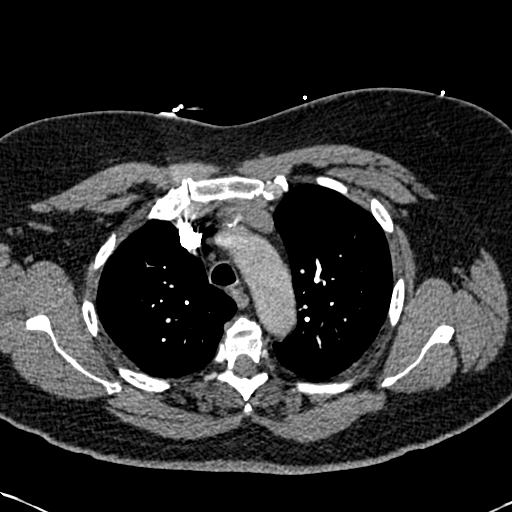
[im 214/257  lung]
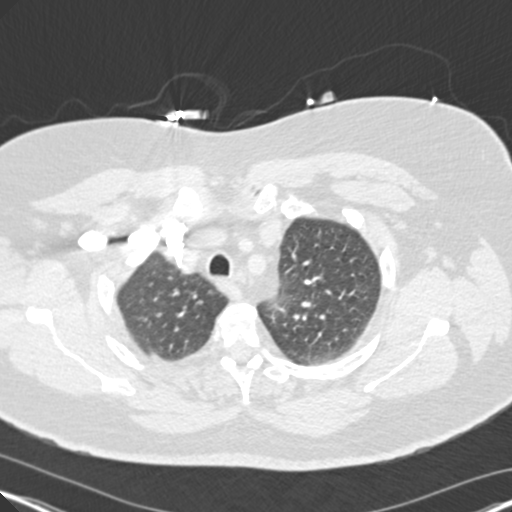
[im 228/257  mediastinal]
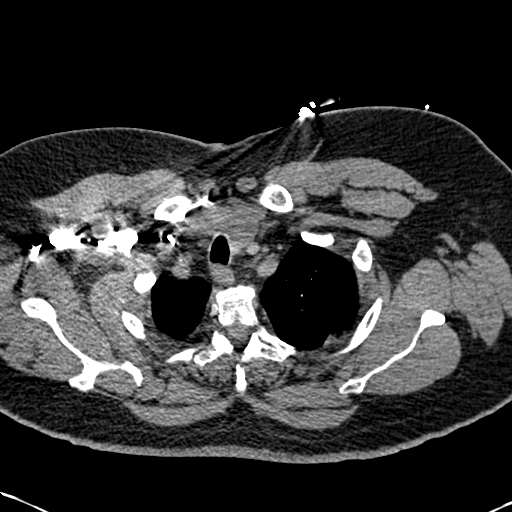
[im 242/257  lung]
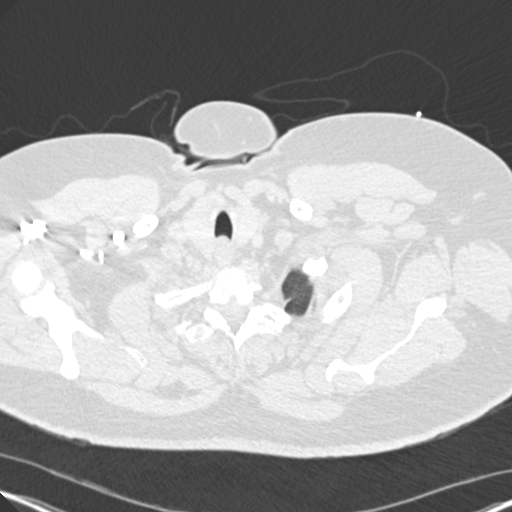

[17 of 36 positions shown; findings below may reference images not displayed]

FINDINGS: Cardiovascular: Thoracic aorta demonstrates atherosclerotic
calcifications. No aneurysmal dilatation is seen. The degree of
opacification is limited for evaluation of possible dissection.
Pulmonary artery is well visualized with a normal branching pattern.
No intraluminal filling defect is seen to suggest pulmonary
embolism.

Mediastinum/Nodes: Thoracic inlet is within normal limits. No
sizable hilar or mediastinal adenopathy is noted. The esophagus as
visualized is within normal limits.

Lungs/Pleura: Lungs are well aerated bilaterally. No focal
infiltrate or sizable effusion is seen. No parenchymal nodules are
noted.

Upper Abdomen: Visualized upper abdomen appears within normal
limits.

Musculoskeletal: Degenerative changes of the thoracic spine are
noted.

Review of the MIP images confirms the above findings.
IMPRESSION: No evidence of pulmonary emboli.

No acute abnormality noted.

Aortic Atherosclerosis (A778D-G2L.L).

## 2023-03-11 DIAGNOSIS — K056 Periodontal disease, unspecified: Secondary | ICD-10-CM | POA: Insufficient documentation

## 2023-05-12 ENCOUNTER — Ambulatory Visit
Admission: EM | Admit: 2023-05-12 | Discharge: 2023-05-12 | Disposition: A | Discharge status: Home / Self Care | Payer: Commercial Managed Care - HMO

## 2023-05-12 DIAGNOSIS — H60502 Unspecified acute noninfective otitis externa, left ear: Secondary | ICD-10-CM

## 2023-05-12 MED ORDER — CIPROFLOXACIN-DEXAMETHASONE 0.3-0.1 % OT SUSP: 4.0000 [drp] | Freq: Two times a day (BID) | OTIC | 0 refills | Status: AC

## 2023-05-12 NOTE — ED Triage Notes (Signed)
"  My left ear is hurting me, I had this same problem before and it seem's to be the same, it's got the whole side of my face hurting with redness/swelling on outside of ear". No injury. No fever.

## 2023-05-12 NOTE — ED Provider Notes (Signed)
[facility]    CSN: [id number] Arrival date & time: 05/12/23  0849      History   Chief Complaint Chief Complaint  Patient presents with   Otalgia    HPI LABREA ECCLESTON is a 57 y.o. female.   Patient here today for evaluation of left ear pain.  She states she has had similar in the past.  She notes that the whole side of her face has started to become painful and she has some redness and swelling on the outside of her ear.  She denies any injury.  She has not any fever.  She denies any congestion or cough.  She does not report treatment for symptoms.  The history is provided by the patient.  Otalgia Associated symptoms: no abdominal pain, no congestion, no fever and no vomiting     Past Medical History:  Diagnosis Date   Arthritis    right hip   Chest pain    Diabetes mellitus without complication [facility])     Patient Active Problem List   Diagnosis Date Noted   Periodontal disease, unspecified 03/11/2023   Primary osteoarthritis of right hip 03/17/2022   Aortic atherosclerosis (HCC) 06/24/2021   Precordial chest pain 06/24/2021   Arthritis of right hip 12/19/2019   Pain in joint of right hip 10/14/2017    Past Surgical History:  Procedure Laterality Date   KNEE SURGERY  [id number]   x2   TUBAL LIGATION  1991    OB History   No obstetric history on file.      Home Medications    Prior to Admission medications   Medication Sig Start Date End Date Taking? Authorizing Provider  ASPIRIN 81 PO Take by mouth.   Yes [provider]  glipiZIDE (GLUCOTROL XL) 10 MG 24 hr tablet Take 10 mg by mouth daily. 04/22/23  Yes [provider]  Ibuprofen-Famotidine (DUEXIS) 800-26.6 MG TABS Take 1 tablet by mouth 3 (three) times daily. 10/14/17  Yes [provider]  meloxicam (MOBIC) 15 MG tablet Take 1 tablet by mouth daily. 02/19/21  Yes [provider]  acetaminophen (TYLENOL) 650 MG CR tablet Take 650 mg by mouth every 8  (eight) hours as needed for pain.    [provider]  atorvastatin (LIPITOR) 40 MG tablet Take 1 tablet (40 mg total) by mouth daily. Patient not taking: Reported on 03/05/2022 07/24/21   Orbie Pyo, MD  ciprofloxacin-dexamethasone [facility]) OTIC suspension Place 4 drops into the left ear 2 (two) times daily.    [provider]  diazepam (VALIUM) 5 MG tablet Take 5 mg by mouth as directed. TK 1 T PO 30 MINS PRIOR TO PROCEDURE    [provider]  empagliflozin (JARDIANCE) 10 MG TABS tablet Take 1 tablet (10 mg total) by mouth daily before breakfast. 07/24/21   Orbie Pyo, MD  glipiZIDE (GLUCOTROL XL) 2.5 MG 24 hr tablet Take 2.5 mg by mouth every morning. 06/28/21   [provider]  glipiZIDE (GLUCOTROL) 10 MG tablet Take 10 mg by mouth daily before breakfast.    [provider]  loperamide (IMODIUM) 2 MG capsule Take 1 capsule (2 mg total) by mouth 4 (four) times daily as needed for diarrhea or loose stools. 01/20/22   White, Elita Boone, NP  losartan (COZAAR) 25 MG tablet Take 1 tablet (25 mg total) by mouth daily. Patient not taking: Reported on 03/05/2022 07/24/21   Orbie Pyo, MD  Na Sulfate-K Sulfate-Mg Sulf  17.5-3.13-1.6 GM/177ML SOLN Suprep (no substitutions)-TAKE AS DIRECTED. Patient not taking: Reported on 07/24/2021 01/08/20   Armbruster, Willaim Rayas, MD    Family History Family History  Problem Relation Age of Onset   Heart disease Mother    Diabetes Mother    Hyperlipidemia Mother    Kidney disease Mother    Thyroid disease Mother    Diabetes Sister    Stroke Sister    Cancer Sister    Breast cancer Sister    Heart disease Brother    Colon cancer Brother 33   Colon polyps Neg Hx    Esophageal cancer Neg Hx    Rectal cancer Neg Hx    Stomach cancer Neg Hx     Social History Social History   Tobacco Use   Smoking status: Every Day    Current packs/day: 0.50    Types: Cigarettes   Smokeless tobacco: Never  Vaping  Use   Vaping status: Never Used  Substance Use Topics   Alcohol use: No   Drug use: No     Allergies   Patient has no known allergies.   Review of Systems Review of Systems  Constitutional:  Negative for chills and fever.  HENT:  Positive for ear pain. Negative for congestion.   Eyes:  Negative for discharge and redness.  Respiratory:  Negative for shortness of breath.   Gastrointestinal:  Negative for abdominal pain, nausea and vomiting.     Physical Exam Triage Vital Signs ED Triage Vitals  Encounter Vitals Group     BP --      Systolic BP Percentile --      Diastolic BP Percentile --      Pulse --      Resp --      Temp --      Temp src --      SpO2 --      Weight 05/12/23 0856 253 lb 6.4 oz (114.9 kg)     Height 05/12/23 0856 5' 7.5" (1.715 m)     Head Circumference --      Peak Flow --      Pain Score 05/12/23 0854 8     Pain Loc --      Pain Education --      Exclude from Growth Chart --    No data found.  Updated Vital Signs BP 139/85 (BP Location: Left Arm)   Pulse 89   Temp 97.7 F (36.5 C) (Oral)   Resp 18   Ht 5' 7.5" (1.715 m)   Wt 253 lb 6.4 oz (114.9 kg)   LMP 08/01/2017 (Approximate) Comment: preg test waiver signed  SpO2 97%   BMI 39.10 kg/m      Physical Exam Vitals and nursing note reviewed.  Constitutional:      General: She is not in acute distress.    Appearance: Normal appearance. She is not ill-appearing.  HENT:     Head: Normocephalic and atraumatic.     Right Ear: Tympanic membrane and ear canal normal.     Ears:     Comments: Left EAC inflamed and erythematous, tragal movement tenderness noted    Nose: Nose normal. No congestion or rhinorrhea.     Mouth/Throat:     Mouth: Mucous membranes are moist.  Eyes:     Conjunctiva/sclera: Conjunctivae normal.  Cardiovascular:     Rate and Rhythm: Normal rate.  Pulmonary:     Effort: Pulmonary effort is normal. No respiratory distress.  Neurological:  Mental Status: She  is alert.  Psychiatric:        Mood and Affect: Mood normal.        Behavior: Behavior normal.        Thought Content: Thought content normal.      UC Treatments / Results  Labs (all labs ordered are listed, but only abnormal results are displayed) Labs Reviewed - No data to display  EKG   Radiology No results found.  Procedures Procedures (including critical care time)  Medications Ordered in UC Medications - No data to display  Initial Impression / Assessment and Plan / UC Course  I have reviewed the triage vital signs and the nursing notes.  Pertinent labs & imaging results that were available during my care of the patient were reviewed by me and considered in my medical decision making (see chart for details).    Will treat to cover otitis externa with Ciprodex.  Recommended follow-up if no gradual improvement or with any further concerns.  Patient expresses understanding.  Final Clinical Impressions(s) / UC Diagnoses   Final diagnoses:  Acute otitis externa of left ear, unspecified type   Discharge Instructions   None    ED Prescriptions     Medication Sig Dispense Auth. Provider   ciprofloxacin-dexamethasone (CIPRODEX) OTIC suspension Place 4 drops into the left ear 2 (two) times daily for 7 days. 7.5 mL Tomi Bamberger, PA-C      PDMP not reviewed this encounter.   Tomi Bamberger, PA-C 05/21/23 1956

## 2023-05-13 ENCOUNTER — Ambulatory Visit
Admission: EM | Admit: 2023-05-13 | Discharge: 2023-05-13 | Disposition: A | Discharge status: Home / Self Care | Payer: Commercial Managed Care - HMO | Attending: Family Medicine | Admitting: Family Medicine

## 2023-05-13 DIAGNOSIS — H60392 Other infective otitis externa, left ear: Secondary | ICD-10-CM

## 2023-05-13 MED ORDER — METHYLPREDNISOLONE ACETATE 40 MG/ML IJ SUSP
40.0000 mg | Freq: Once | INTRAMUSCULAR | Status: AC
  Administered 2023-05-13: 40 mg via INTRAMUSCULAR

## 2023-05-13 NOTE — Discharge Instructions (Signed)
You were seen today for continued ear pain. I have put an ear wick in the left ear today with your ear drops.  Continue to use the ear drops four times/day.   You may return to have the wick removed when the swelling improves.  Otherwise return if not improving.

## 2023-05-13 NOTE — ED Triage Notes (Signed)
"  I was seen yesterday and have an ear infection and using Rx drops but they aren't working". My left ear still hurts and "feels like it is closing". No fever.

## 2023-05-13 NOTE — ED Provider Notes (Signed)
[facility]    CSN: [id number] Arrival date & time: 05/13/23  1019      History   Chief Complaint Chief Complaint  Patient presents with  . Follow-up    HPI Rachael Robinson is a 57 y.o. female.   Patient is here for continued ear pain.  She was seen yesterday for left otitis externa.  She now feels that the ear is closing up.  Worse at night.  Given ciprodex since yesterday.  She has muffled hearing.   No fevers/chill, body aches.  No sore throat.  Hurts to chew.  She last used the drops last evening.         Past Medical History:  Diagnosis Date  . Arthritis    right hip  . Chest pain   . Diabetes mellitus without complication [facility])     Patient Active Problem List   Diagnosis Date Noted  . Periodontal disease, unspecified 03/11/2023  . Primary osteoarthritis of right hip 03/17/2022  . Aortic atherosclerosis (HCC) 06/24/2021  . Precordial chest pain 06/24/2021  . Arthritis of right hip 12/19/2019  . Pain in joint of right hip 10/14/2017    Past Surgical History:  Procedure Laterality Date  . KNEE SURGERY  [id number]   x2  . TUBAL LIGATION  1991    OB History   No obstetric history on file.      Home Medications    Prior to Admission medications   Medication Sig Start Date End Date Taking? Authorizing Provider  ASPIRIN 81 PO Take by mouth.   Yes [provider]  ciprofloxacin-dexamethasone (CIPRODEX) OTIC suspension Place 4 drops into the left ear 2 (two) times daily.   Yes [provider]  glipiZIDE (GLUCOTROL) 10 MG tablet Take 10 mg by mouth daily before breakfast.   Yes [provider]  acetaminophen (TYLENOL) 650 MG CR tablet Take 650 mg by mouth every 8 (eight) hours as needed for pain.    [provider]  atorvastatin (LIPITOR) 40 MG tablet Take 1 tablet (40 mg total) by mouth daily. Patient not taking: Reported on 03/05/2022 07/24/21   Orbie Pyo, MD  ciprofloxacin-dexamethasone  [facility]) OTIC suspension Place 4 drops into the left ear 2 (two) times daily for 7 days. 05/12/23 05/19/23  Tomi Bamberger, PA-C  diazepam (VALIUM) 5 MG tablet Take 5 mg by mouth as directed. TK 1 T PO 30 MINS PRIOR TO PROCEDURE    [provider]  empagliflozin (JARDIANCE) 10 MG TABS tablet Take 1 tablet (10 mg total) by mouth daily before breakfast. 07/24/21   Orbie Pyo, MD  glipiZIDE (GLUCOTROL XL) 10 MG 24 hr tablet Take 10 mg by mouth daily. 04/22/23   [provider]  glipiZIDE (GLUCOTROL XL) 2.5 MG 24 hr tablet Take 2.5 mg by mouth every morning. 06/28/21   [provider]  Ibuprofen-Famotidine (DUEXIS) 800-26.6 MG TABS Take 1 tablet by mouth 3 (three) times daily. 10/14/17   [provider]  loperamide (IMODIUM) 2 MG capsule Take 1 capsule (2 mg total) by mouth 4 (four) times daily as needed for diarrhea or loose stools. 01/20/22   White, Elita Boone, NP  losartan (COZAAR) 25 MG tablet Take 1 tablet (25 mg total) by mouth daily. Patient not taking: Reported on 03/05/2022 07/24/21   Orbie Pyo, MD  meloxicam (MOBIC) 15 MG tablet Take 1 tablet by mouth daily. 02/19/21   [provider]  Na Sulfate-K Sulfate-Mg Sulf 17.5-3.13-1.6 GM/177ML SOLN  Suprep (no substitutions)-TAKE AS DIRECTED. Patient not taking: Reported on 07/24/2021 01/08/20   Armbruster, Willaim Rayas, MD    Family History Family History  Problem Relation Age of Onset  . Heart disease Mother   . Diabetes Mother   . Hyperlipidemia Mother   . Kidney disease Mother   . Thyroid disease Mother   . Diabetes Sister   . Stroke Sister   . Cancer Sister   . Breast cancer Sister   . Heart disease Brother   . Colon cancer Brother 28  . Colon polyps Neg Hx   . Esophageal cancer Neg Hx   . Rectal cancer Neg Hx   . Stomach cancer Neg Hx     Social History Social History   Tobacco Use  . Smoking status: Every Day    Current packs/day: 0.50    Types: Cigarettes  . Smokeless  tobacco: Never  Vaping Use  . Vaping status: Never Used  Substance Use Topics  . Alcohol use: No  . Drug use: No     Allergies   Patient has no known allergies.   Review of Systems Review of Systems  Constitutional: Negative.   HENT:  Positive for ear pain. Negative for congestion and rhinorrhea.   Respiratory: Negative.    Cardiovascular: Negative.   Gastrointestinal: Negative.   Musculoskeletal: Negative.   Psychiatric/Behavioral: Negative.       Physical Exam Triage Vital Signs ED Triage Vitals  Encounter Vitals Group     BP 05/13/23 1038 117/76     Systolic BP Percentile --      Diastolic BP Percentile --      Pulse Rate 05/13/23 1038 93     Resp 05/13/23 1038 18     Temp 05/13/23 1038 97.8 F (36.6 C)     Temp Source 05/13/23 1038 Oral     SpO2 05/13/23 1038 96 %     Weight 05/13/23 1037 253 lb 4.9 oz (114.9 kg)     Height 05/13/23 1037 5' 7.5" (1.715 m)     Head Circumference --      Peak Flow --      Pain Score 05/13/23 1035 10     Pain Loc --      Pain Education --      Exclude from Growth Chart --    No data found.  Updated Vital Signs BP 117/76 (BP Location: Left Arm)   Pulse 93   Temp 97.8 F (36.6 C) (Oral)   Resp 18   Ht 5' 7.5" (1.715 m)   Wt 114.9 kg   LMP 08/01/2017 (Approximate) Comment: preg test waiver signed  SpO2 96%   BMI 39.09 kg/m   Visual Acuity Right Eye Distance:   Left Eye Distance:   Bilateral Distance:    Right Eye Near:   Left Eye Near:    Bilateral Near:     Physical Exam Constitutional:      Appearance: Normal appearance.  HENT:     Left Ear: Swelling and tenderness present.  Cardiovascular:     Rate and Rhythm: Normal rate and regular rhythm.  Pulmonary:     Effort: Pulmonary effort is normal.     Breath sounds: Normal breath sounds.  Musculoskeletal:     Cervical back: Normal range of motion and neck supple.  Neurological:     General: No focal deficit present.     Mental Status: She is alert.   Psychiatric:        Mood and  Affect: Mood normal.     UC Treatments / Results  Labs (all labs ordered are listed, but only abnormal results are displayed) Labs Reviewed - No data to display  EKG   Radiology No results found.  Procedures Ear wick placed to the left ear canal;  Patient's supplied ear drops were used to expand;  Patient tolerated well;  Ear wick in place  Medications Ordered in UC Medications  methylPREDNISolone acetate (DEPO-MEDROL) injection 40 mg (has no administration in time range)    Initial Impression / Assessment and Plan / UC Course  I have reviewed the triage vital signs and the nursing notes.  Pertinent labs & imaging results that were available during my care of the patient were reviewed by me and considered in my medical decision making (see chart for details).   Final Clinical Impressions(s) / UC Diagnoses   Final diagnoses:  Infective otitis externa of left ear     Discharge Instructions      You were seen today for continued ear pain. I have put an ear wick in the left ear today with your ear drops.  Continue to use the ear drops four times/day.   You may return to have the wick removed when the swelling improves.  Otherwise return if not improving.     ED Prescriptions   None    PDMP not reviewed this encounter.   Jannifer Franklin, MD 05/13/23 1212

## 2023-05-21 ENCOUNTER — Encounter: Payer: Self-pay | Admitting: Physician Assistant

## 2024-05-26 ENCOUNTER — Emergency Department (HOSPITAL_COMMUNITY): Admission: EM | Admit: 2024-05-26 | Discharge: 2024-05-26 | Disposition: A | Discharge status: Home / Self Care

## 2024-05-26 ENCOUNTER — Encounter (HOSPITAL_COMMUNITY): Payer: Self-pay | Admitting: Emergency Medicine

## 2024-05-26 ENCOUNTER — Emergency Department (HOSPITAL_COMMUNITY)

## 2024-05-26 ENCOUNTER — Other Ambulatory Visit: Payer: Self-pay

## 2024-05-26 DIAGNOSIS — E119 Type 2 diabetes mellitus without complications: Secondary | ICD-10-CM | POA: Diagnosis not present

## 2024-05-26 DIAGNOSIS — Z7984 Long term (current) use of oral hypoglycemic drugs: Secondary | ICD-10-CM | POA: Insufficient documentation

## 2024-05-26 DIAGNOSIS — R079 Chest pain, unspecified: Secondary | ICD-10-CM | POA: Insufficient documentation

## 2024-05-26 DIAGNOSIS — Z7982 Long term (current) use of aspirin: Secondary | ICD-10-CM | POA: Insufficient documentation

## 2024-05-26 LAB — BASIC METABOLIC PANEL WITH GFR
Anion gap: 11 (ref 5–15)
BUN: 14 mg/dL (ref 6–20)
CO2: 22 mmol/L (ref 22–32)
Calcium: 9.5 mg/dL (ref 8.9–10.3)
Chloride: 106 mmol/L (ref 98–111)
Creatinine, Ser: 0.9 mg/dL (ref 0.44–1.00)
GFR, Estimated: >60 mL/min (ref >60)
Glucose, Bld: 78 mg/dL (ref 70–99)
Potassium: 4.4 mmol/L (ref 3.5–5.1)
Sodium: 139 mmol/L (ref 135–145)

## 2024-05-26 LAB — CBC
HCT: 43.9 % (ref 36.0–46.0)
Hemoglobin: 14.7 g/dL (ref 12.0–15.0)
MCH: 33.2 pg (ref 26.0–34.0)
MCHC: 33.5 g/dL (ref 30.0–36.0)
MCV: 99.1 fL (ref 80.0–100.0)
Platelets: 205 K/uL (ref 150–400)
RBC: 4.43 MIL/uL (ref 3.87–5.11)
RDW: 12 % (ref 11.5–15.5)
WBC: 7.7 K/uL (ref 4.0–10.5)
nRBC: 0 % (ref 0.0–0.2)

## 2024-05-26 LAB — D-DIMER, QUANTITATIVE: D-Dimer, Quant: 0.52 ug{FEU}/mL — ABNORMAL HIGH (ref 0.00–0.50)

## 2024-05-26 LAB — TROPONIN I (HIGH SENSITIVITY)
Troponin I (High Sensitivity): 5 ng/L (ref <18)
Troponin I (High Sensitivity): 5 ng/L (ref <18)

## 2024-05-26 NOTE — ED Provider Notes (Signed)
 [place] [facility] AT [facility] Provider Note   CSN: [id number] Arrival date & time: 05/26/24  1358     Patient presents with: Chest Pain  HPI Rachael Robinson is a 58 y.o. female with diabetes presenting for chest pain.  Started about an hour and a half ago.  It started in the left shoulder and left upper back and radiated to the chest.  She states she was just getting out of the shower and sat down and the pain started.  Also endorses shortness of breath that came on acutely as well at that time.  She states the pain lasted for about 20 minutes and felt like a aching pain and then resolved.  Denies chest pain or shortness of breath at this time.  She denies calf swelling or tenderness, OCP use, recent long trips or immobilizations or known history of blood clots.  She does report that she had a similar pain 2 weeks ago that also self resolved.    Chest Pain      Prior to Admission medications   Medication Sig Start Date End Date Taking? Authorizing Provider  acetaminophen  (TYLENOL ) 650 MG CR tablet Take 650 mg by mouth every 8 (eight) hours as needed for pain.   Yes [provider]  aspirin EC 81 MG tablet Take 81 mg by mouth daily. Swallow whole.   Yes [provider]  glipiZIDE (GLUCOTROL XL) 10 MG 24 hr tablet Take 10 mg by mouth daily. 04/22/23  Yes [provider]  metFORMIN  (GLUCOPHAGE -XR) 500 MG 24 hr tablet Take 500 mg by mouth daily with breakfast. 02/28/24  Yes [provider]    Allergies: Patient has no known allergies.    Review of Systems  Cardiovascular:  Positive for chest pain.    Updated Vital Signs BP 133/75   Pulse 75   Temp (!) 97.5 F (36.4 C) (Oral)   Resp 20   Ht 5' 7 (1.702 m)   Wt 117.9 kg   LMP 08/01/2017 (Approximate) Comment: preg test waiver signed  SpO2 100%   BMI 40.72 kg/m   Physical Exam Vitals and nursing note reviewed.  HENT:     Head: Normocephalic and atraumatic.      Mouth/Throat:     Mouth: Mucous membranes are moist.  Eyes:     General:        Right eye: No discharge.        Left eye: No discharge.     Conjunctiva/sclera: Conjunctivae normal.  Cardiovascular:     Rate and Rhythm: Normal rate and regular rhythm.     Pulses: Normal pulses.     Heart sounds: Normal heart sounds.  Pulmonary:     Effort: Pulmonary effort is normal.     Breath sounds: Normal breath sounds.  Abdominal:     General: Abdomen is flat.     Palpations: Abdomen is soft.  Skin:    General: Skin is warm and dry.  Neurological:     General: No focal deficit present.  Psychiatric:        Mood and Affect: Mood normal.     (all labs ordered are listed, but only abnormal results are displayed) Labs Reviewed  D-DIMER, QUANTITATIVE - Abnormal; Notable for the following components:      Result Value   D-Dimer, Quant 0.52 (*)    All other components within normal limits  BASIC METABOLIC PANEL WITH GFR  CBC  TROPONIN I (HIGH SENSITIVITY)  TROPONIN  I (HIGH SENSITIVITY)    EKG: EKG Interpretation Date/Time:  Friday May 26 2024 14:10:36 EDT Ventricular Rate:  79 PR Interval:  126 QRS Duration:  88 QT Interval:  384 QTC Calculation: 440 R Axis:   39  Text Interpretation: Normal sinus rhythm Low voltage QRS When compared with ECG of 21-Apr-2021 09:17, PREVIOUS ECG IS PRESENT Confirmed by Ruthe Cornet [phone]) on 05/26/2024 3:15:17 PM  Radiology: [facility] Chest 2 View Result Date: 05/26/2024 EXAM: 2 VIEW(S) XRAY OF THE CHEST 05/26/2024 03:03:07 PM COMPARISON: None available. CLINICAL HISTORY: chest pain. Per chart - Pt to ER via EMS from home with c/o pain staring in left shoulder that radiated into left arm and chest and down into back. Pain described as aching, lasted approximately 20 mins and subsided on its own. Pt denies associated symptoms. FINDINGS: LUNGS AND PLEURA: Mild bronchitic markings. No focal pulmonary opacity. No pulmonary edema. No pleural effusion. No  pneumothorax. HEART AND MEDIASTINUM: Normal cardiac silhouette. BONES AND SOFT TISSUES: Degenerative spurring of the spine. IMPRESSION: 1. No acute cardiopulmonary process. Electronically signed by: Norleen Boxer MD 05/26/2024 03:55 PM EDT RP Workstation: [id number]     Procedures   Medications Ordered in the ED - No data to display                                  Medical Decision Making Amount and/or Complexity of Data Reviewed Labs: ordered. Radiology: ordered.   58 year old well-appearing female presenting for chest pain.  Exam is unremarkable.  Has been asymptomatic thus far through encounter.  EKG showing normal sinus rhythm.  CBC and BMP are normal.  Initial troponin is 5.  D-dimer is in process although suspicion for PE is low given lack of pleuritic chest pain and normal vitals with reassuring EKG.  Signed out patient to oncoming ED attending Dr. Ruthe.  Anticipate discharge with PCP follow-up if remaining workup is reassuring.      Final diagnoses:  Chest pain, unspecified type    ED Discharge Orders          Ordered    Ambulatory referral to Cardiology       Comments: If you have not heard from the Cardiology office within the next 72 hours please call [phone].   05/26/24 1749               Lang Norleen POUR, PA-C 05/27/24 0631    Ula Prentice SAUNDERS, MD 05/29/24 2328

## 2024-05-26 NOTE — ED Provider Notes (Signed)
 Patient here with chest pain feeling better now my evaluation.  Troponin negative x 2.  EKG shows sinus rhythm.  No ischemic changes.  Overall I have no concern for ACS given history and physical.  Chest x-ray shows no evidence of pneumonia.  D-dimer is age-adjusted normal.  Doubt PE.  Overall do think some sort of muscular skeletal process.  Does have diabetes but heart score is 1.  Will have her follow-up with cardiology outpatient for further cardiac workup but overall I think at this time she stable for discharge.  She understands return if symptoms worsen.  Discharge.  This chart was dictated using voice recognition software.  Despite best efforts to proofread,  errors can occur which can change the documentation meaning.    Rachael Cornet, DO 05/26/24 1752

## 2024-05-26 NOTE — Discharge Instructions (Signed)
 Follow-up with your primary care doctor.  I put in a referral for cardiology for you as well.  Return if symptoms worsen.

## 2024-05-26 NOTE — ED Notes (Signed)
 PT discharged to home.  Breathing is even and unlabored. Skin warm, dry and natural in color. PT educated on follow up.  PT to follow up as directed on discharge instructions. No further questions at this time.

## 2024-05-26 NOTE — ED Triage Notes (Signed)
 Pt to ER via EMS from home with c/o pain staring in left shoulder that radiated into left arm and chest and down into back.  Pain described as aching, lasted approximately 20 mins and subsided on its own.  Pt denies associated symptoms.
